# Patient Record
Sex: Male | Born: 1955 | Race: White | Hispanic: No | Marital: Married | Smoking: Never smoker
Health system: Southern US, Community
[De-identification: ages and names within clinical notes are randomized; demographics above are authoritative.]

## PROBLEM LIST (undated history)

## (undated) DIAGNOSIS — E785 Hyperlipidemia, unspecified: Secondary | ICD-10-CM

## (undated) DIAGNOSIS — H9190 Unspecified hearing loss, unspecified ear: Secondary | ICD-10-CM

## (undated) DIAGNOSIS — R911 Solitary pulmonary nodule: Secondary | ICD-10-CM

## (undated) DIAGNOSIS — Z87448 Personal history of other diseases of urinary system: Secondary | ICD-10-CM

## (undated) DIAGNOSIS — I10 Essential (primary) hypertension: Secondary | ICD-10-CM

## (undated) DIAGNOSIS — K649 Unspecified hemorrhoids: Secondary | ICD-10-CM

## (undated) DIAGNOSIS — I7121 Aneurysm of the ascending aorta, without rupture: Secondary | ICD-10-CM

## (undated) DIAGNOSIS — J189 Pneumonia, unspecified organism: Secondary | ICD-10-CM

## (undated) DIAGNOSIS — I712 Thoracic aortic aneurysm, without rupture: Secondary | ICD-10-CM

## (undated) DIAGNOSIS — N400 Enlarged prostate without lower urinary tract symptoms: Secondary | ICD-10-CM

## (undated) DIAGNOSIS — K219 Gastro-esophageal reflux disease without esophagitis: Secondary | ICD-10-CM

## (undated) DIAGNOSIS — E739 Lactose intolerance, unspecified: Secondary | ICD-10-CM

## (undated) DIAGNOSIS — G4700 Insomnia, unspecified: Secondary | ICD-10-CM

## (undated) DIAGNOSIS — R739 Hyperglycemia, unspecified: Secondary | ICD-10-CM

## (undated) HISTORY — DX: Lactose intolerance, unspecified: E73.9

## (undated) HISTORY — DX: Essential (primary) hypertension: I10

## (undated) HISTORY — DX: Hyperlipidemia, unspecified: E78.5

## (undated) HISTORY — DX: Hyperglycemia, unspecified: R73.9

## (undated) HISTORY — DX: Insomnia, unspecified: G47.00

## (undated) HISTORY — DX: Gastro-esophageal reflux disease without esophagitis: K21.9

## (undated) HISTORY — PX: KNEE SURGERY: SHX244

## (undated) HISTORY — DX: Aneurysm of the ascending aorta, without rupture: I71.21

## (undated) HISTORY — DX: Solitary pulmonary nodule: R91.1

## (undated) HISTORY — DX: Pneumonia, unspecified organism: J18.9

## (undated) HISTORY — DX: Unspecified hemorrhoids: K64.9

## (undated) HISTORY — DX: Unspecified hearing loss, unspecified ear: H91.90

## (undated) HISTORY — DX: Personal history of other diseases of urinary system: Z87.448

## (undated) HISTORY — DX: Benign prostatic hyperplasia without lower urinary tract symptoms: N40.0

## (undated) HISTORY — DX: Thoracic aortic aneurysm, without rupture: I71.2

---

## 1977-08-05 HISTORY — PX: OTHER SURGICAL HISTORY: SHX169

## 1999-08-06 HISTORY — PX: OTHER SURGICAL HISTORY: SHX169

## 2004-09-25 ENCOUNTER — Ambulatory Visit: Payer: Self-pay | Admitting: Internal Medicine

## 2004-10-03 ENCOUNTER — Ambulatory Visit: Payer: Self-pay | Admitting: Internal Medicine

## 2005-02-08 ENCOUNTER — Ambulatory Visit: Payer: Self-pay | Admitting: Internal Medicine

## 2010-06-14 ENCOUNTER — Ambulatory Visit: Payer: Self-pay | Admitting: Cardiology

## 2010-06-15 ENCOUNTER — Ambulatory Visit: Payer: Self-pay | Admitting: Cardiology

## 2011-06-17 ENCOUNTER — Other Ambulatory Visit: Payer: Self-pay | Admitting: Nurse Practitioner

## 2011-06-17 MED ORDER — SIMVASTATIN 20 MG PO TABS: 20.0000 mg | ORAL_TABLET | Freq: Every day | ORAL | Status: AC

## 2011-07-24 ENCOUNTER — Other Ambulatory Visit: Payer: Self-pay | Admitting: Cardiology

## 2011-08-15 ENCOUNTER — Other Ambulatory Visit: Payer: Self-pay | Admitting: Nurse Practitioner

## 2011-08-19 ENCOUNTER — Other Ambulatory Visit: Payer: Self-pay | Admitting: Nurse Practitioner

## 2011-10-17 ENCOUNTER — Encounter: Payer: Self-pay | Admitting: *Deleted

## 2011-10-17 DIAGNOSIS — Z87448 Personal history of other diseases of urinary system: Secondary | ICD-10-CM | POA: Insufficient documentation

## 2015-05-22 ENCOUNTER — Other Ambulatory Visit: Payer: Self-pay | Admitting: Internal Medicine

## 2015-05-22 DIAGNOSIS — J181 Lobar pneumonia, unspecified organism: Principal | ICD-10-CM

## 2015-05-22 DIAGNOSIS — J189 Pneumonia, unspecified organism: Secondary | ICD-10-CM

## 2015-05-26 ENCOUNTER — Ambulatory Visit
Admission: RE | Admit: 2015-05-26 | Discharge: 2015-05-26 | Disposition: A | Discharge status: Home / Self Care | Payer: BLUE CROSS/BLUE SHIELD | Source: Ambulatory Visit | Attending: Internal Medicine | Admitting: Internal Medicine

## 2015-05-26 DIAGNOSIS — J189 Pneumonia, unspecified organism: Secondary | ICD-10-CM

## 2015-05-26 DIAGNOSIS — J181 Lobar pneumonia, unspecified organism: Principal | ICD-10-CM

## 2015-05-26 MED ORDER — IOPAMIDOL (ISOVUE-300) INJECTION 61%
75.0000 mL | Freq: Once | INTRAVENOUS | Status: AC | PRN
  Administered 2015-05-26: 75 mL via INTRAVENOUS

## 2015-05-29 ENCOUNTER — Other Ambulatory Visit: Payer: Self-pay | Admitting: Internal Medicine

## 2015-05-29 DIAGNOSIS — R918 Other nonspecific abnormal finding of lung field: Secondary | ICD-10-CM

## 2015-05-29 DIAGNOSIS — J189 Pneumonia, unspecified organism: Secondary | ICD-10-CM

## 2015-05-29 DIAGNOSIS — J181 Lobar pneumonia, unspecified organism: Secondary | ICD-10-CM

## 2015-06-26 ENCOUNTER — Other Ambulatory Visit: Payer: BLUE CROSS/BLUE SHIELD

## 2015-07-03 ENCOUNTER — Ambulatory Visit
Admission: RE | Admit: 2015-07-03 | Discharge: 2015-07-03 | Disposition: A | Discharge status: Home / Self Care | Payer: BLUE CROSS/BLUE SHIELD | Source: Ambulatory Visit | Attending: Internal Medicine | Admitting: Internal Medicine

## 2015-07-03 DIAGNOSIS — J189 Pneumonia, unspecified organism: Secondary | ICD-10-CM

## 2015-07-03 DIAGNOSIS — R918 Other nonspecific abnormal finding of lung field: Secondary | ICD-10-CM

## 2015-07-03 DIAGNOSIS — J181 Lobar pneumonia, unspecified organism: Secondary | ICD-10-CM

## 2015-07-03 MED ORDER — IOPAMIDOL (ISOVUE-300) INJECTION 61%
75.0000 mL | Freq: Once | INTRAVENOUS | Status: AC | PRN
  Administered 2015-07-03: 75 mL via INTRAVENOUS

## 2015-07-11 ENCOUNTER — Encounter: Payer: Self-pay | Admitting: Thoracic Surgery (Cardiothoracic Vascular Surgery)

## 2015-07-11 ENCOUNTER — Institutional Professional Consult (permissible substitution) (INDEPENDENT_AMBULATORY_CARE_PROVIDER_SITE_OTHER): Payer: BLUE CROSS/BLUE SHIELD | Admitting: Thoracic Surgery (Cardiothoracic Vascular Surgery)

## 2015-07-11 VITALS — BP 128/80 | HR 86 | Resp 20 | Ht 68.0 in | Wt 163.0 lb

## 2015-07-11 DIAGNOSIS — I7121 Aneurysm of the ascending aorta, without rupture: Secondary | ICD-10-CM

## 2015-07-11 DIAGNOSIS — I712 Thoracic aortic aneurysm, without rupture, unspecified: Secondary | ICD-10-CM

## 2015-07-11 DIAGNOSIS — I10 Essential (primary) hypertension: Secondary | ICD-10-CM | POA: Insufficient documentation

## 2015-07-11 NOTE — Progress Notes (Signed)
PCP is Gwen PoundsUSSO,JOHN M, MD Referring Provider is Creola Cornusso, John, MD  Chief Complaint  Patient presents with  . Thoracic Aortic Aneurysm    Surgical eval, Chest CT 07/03/2015    HPI: [name] is a 59 year old gentleman sent for consultation regarding an ascending aortic aneurysm.  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 He was in his usual state of health until October when he developed a left lower lobe pneumonia. A CT of the chest showed a 2.9 x 3.3 cm masslike opacity in the lateral basilar segment of the left lower lobe. He was also noted to have a 4.7 cm aneurysm. A follow-up CT was done in November which showed resolution of the pneumonia and more accurately measured the aorta at 4.5 cm.  Other than having pneumonia he has felt well. He does still have a residual nonproductive cough. He does not have any shortness of breath or wheezing. He denies any chest pain, pressure, or tightness. He has not been having any palpitations, orthopnea, paroxysmal nocturnal dyspnea, or peripheral edema.   Past Medical History  Diagnosis Date  . Hyperlipidemia   . Hypertension   . H/O hematuria   . Ascending aortic aneurysm (HCC)   . Lung nodule   . Pneumonia   . GERD (gastroesophageal reflux disease)   . Lactose intolerance   . Hearing loss   . Insomnia   . BPH (benign prostatic hyperplasia)   . Hemorrhoid   . Hyperglycemia     Past Surgical History  Procedure Laterality Date  . Other surgical history  1979    appendectomy  . Knee surgery      left  . Other surgical history  2001    hernia surgery    Family History  Problem Relation Age of Onset  . Hypertension Mother   . Hypertension Father   . Hypertension Brother   . Cancer Brother     prostate    Social History Social History  Substance Use Topics  . Smoking status: Never Smoker   . Smokeless tobacco: Never Used   . Alcohol Use: 0.0 oz/week    0 Standard drinks or equivalent per week    Current Outpatient Prescriptions  Medication Sig Dispense Refill  . aspirin 81 MG tablet Take 81 mg by mouth daily.    . benzonatate (TESSALON) 100 MG capsule Take 100 mg by mouth 2 (two) times daily as needed.   0  . cefdinir (OMNICEF) 300 MG capsule Take 300 mg by mouth 2 (two) times daily.   0  . lisinopril (PRINIVIL,ZESTRIL) 5 MG tablet TAKE ONE (1) TABLET EACH DAY 30 tablet 0  . simvastatin (ZOCOR) 20 MG tablet Take 1 tablet (20 mg total) by mouth at bedtime. 30 tablet 1  . SYNTHROID 50 MCG tablet Take 50 mcg by mouth daily before breakfast.   2   No current facility-administered medications for this visit.    No Known Allergies  Review of Systems  Constitutional: Negative for fever, chills, activity change, appetite change and unexpected weight change.  HENT: Positive for hearing loss. Negative for trouble swallowing and voice change.   Eyes: Negative for visual disturbance.  Respiratory: Positive for cough (Nonproductive). Negative for shortness of breath and wheezing.   Cardiovascular: Negative for chest pain and leg swelling.  Gastrointestinal: Negative for abdominal pain and blood in stool.  Genitourinary: Positive for hematuria. Negative for difficulty urinating.  Musculoskeletal: Negative  for arthralgias and gait problem.  Neurological: Negative for syncope, speech difficulty and weakness.  Hematological: Negative for adenopathy. Does not bruise/bleed easily.  All other systems reviewed and are negative.   BP 128/80 mmHg  Pulse 86  Resp 20  Ht  (1.727 m)  Wt 163 lb (73.936 kg)  BMI 24.79 kg/m2  SpO2 98% Physical Exam  Constitutional: He is oriented to person, place, and time. He appears well-developed and well-nourished. No distress.  HENT:  Head: Normocephalic and atraumatic.  Mouth/Throat: No oropharyngeal exudate.  Eyes: Conjunctivae and EOM are normal. Pupils are equal, round,  and reactive to light. No scleral icterus.  Neck: Neck supple. No tracheal deviation present. No thyromegaly present.  No bruits  Cardiovascular: Normal rate, regular rhythm, normal heart sounds and intact distal pulses.  Exam reveals no gallop and no friction rub.   No murmur heard. Pulmonary/Chest: Effort normal and breath sounds normal. No respiratory distress. He has no wheezes. He has no rales.  Abdominal: Soft. He exhibits no distension. There is no tenderness.  Musculoskeletal: He exhibits no edema.  Lymphadenopathy:    He has no cervical adenopathy.  Neurological: He is alert and oriented to person, place, and time. No cranial nerve deficit.  Skin: Skin is warm and dry.  Vitals reviewed.    Diagnostic Tests: CT CHEST WITH CONTRAST  TECHNIQUE: Multidetector CT imaging of the chest was performed during intravenous contrast administration.  CONTRAST: 75 cc Isovue 300 intravenous  COMPARISON: 05/26/2015  FINDINGS: THORACIC INLET/BODY WALL:  No acute abnormality.  MEDIASTINUM:  Normal heart size. No pericardial effusion. Fusiform aneurysmal enlargement of the aortic root and ascending aorta. The ascending aorta measures up to 45 mm diameter today. Atherosclerosis of the aorta without acute finding. Left hilar and subcarinal nodes have decreased in size since prior. No adenopathy currently.  LUNG WINDOWS:  The rounded ill-defined opacity in the left lower lobe seen on previous study has contracted, now with a flat/linear opacity in the subpleural basilar lung. No rounded areas suggestive of malignancy. Previously seen internal cavitary features have resolved. No pleural effusion. No suspicious pulmonary nodule elsewhere in the lungs.  UPPER ABDOMEN:  No acute findings.  OSSEOUS:  No acute finding. No suspicious lytic or blastic lesions.  IMPRESSION: 1. Previously seen left lower lobe nodule has regressed, now with linear scar-like  appearance. Evolution compatible with pneumonia rather than malignancy. This area will be visualized on six-month follow-up chest CT for the patient's thoracic aortic aneurysm. 2. 45 mm at the ascending aortic aneurysm. Recommend semi-annual imaging followup by CTA or MRA and referral to cardiothoracic surgery if not already obtained. This recommendation follows 2010 ACCF/AHA/AATS/ACR/ASA/SCA/SCAI/SIR/STS/SVM Guidelines for the Diagnosis and Management of Patients With Thoracic Aortic Disease. Circulation. 2010; 121: G956-O130   Electronically Signed  By: Marnee Spring M.D.  On: 07/03/2015 09:29       I personally reviewed the CT scans and concur with the findings as noted above Impression:  59 year old man with a recent pneumonia who was incidentally found to have a 4.5 cm ascending aortic aneurysm. The aorta returns to normal size between the innominate and left carotid. There is no indication for surgery at this time.  I reviewed the CT images with Mr. and Mrs. Canlas.  We discussed indications for surgery for ascending aneurysms. In this location, a size of 5.5- 6 cm would be an indication for surgery or if there was greater than 5 mm interval growth over a six-month period.   They understand  the importance of blood pressure control.  We discussed the symptoms of rupture and/or dissection and he knows to call for emergent medical assistance immediately if those were to occur.  Plan: Return in 6 months with MRA and June to follow-up ascending aortic aneurysm  I spent 30 minutes with Mr. Vieau during this visit, greater than 50% was spent counseling. Loreli Slot, MD [facility] [phone]

## 2015-07-12 ENCOUNTER — Encounter: Payer: Self-pay | Admitting: Thoracic Surgery (Cardiothoracic Vascular Surgery)

## 2015-12-27 ENCOUNTER — Other Ambulatory Visit: Payer: Self-pay | Admitting: *Deleted

## 2015-12-27 DIAGNOSIS — I7121 Aneurysm of the ascending aorta, without rupture: Secondary | ICD-10-CM

## 2015-12-27 DIAGNOSIS — I712 Thoracic aortic aneurysm, without rupture: Secondary | ICD-10-CM

## 2016-01-16 ENCOUNTER — Ambulatory Visit
Admission: RE | Admit: 2016-01-16 | Discharge: 2016-01-16 | Disposition: A | Discharge status: Home / Self Care | Payer: 59 | Source: Ambulatory Visit | Attending: Thoracic Surgery (Cardiothoracic Vascular Surgery) | Admitting: Thoracic Surgery (Cardiothoracic Vascular Surgery)

## 2016-01-16 ENCOUNTER — Ambulatory Visit (INDEPENDENT_AMBULATORY_CARE_PROVIDER_SITE_OTHER): Payer: 59 | Admitting: Thoracic Surgery (Cardiothoracic Vascular Surgery)

## 2016-01-16 ENCOUNTER — Encounter: Payer: Self-pay | Admitting: Thoracic Surgery (Cardiothoracic Vascular Surgery)

## 2016-01-16 ENCOUNTER — Other Ambulatory Visit: Payer: Self-pay | Admitting: Thoracic Surgery (Cardiothoracic Vascular Surgery)

## 2016-01-16 VITALS — BP 94/60 | HR 94 | Resp 16 | Ht 68.0 in | Wt 160.0 lb

## 2016-01-16 DIAGNOSIS — I712 Thoracic aortic aneurysm, without rupture, unspecified: Secondary | ICD-10-CM

## 2016-01-16 DIAGNOSIS — I7121 Aneurysm of the ascending aorta, without rupture: Secondary | ICD-10-CM

## 2016-01-16 DIAGNOSIS — Z77018 Contact with and (suspected) exposure to other hazardous metals: Secondary | ICD-10-CM

## 2016-01-16 MED ORDER — GADOBENATE DIMEGLUMINE 529 MG/ML IV SOLN
15.0000 mL | Freq: Once | INTRAVENOUS | Status: AC | PRN
  Administered 2016-01-16: 15 mL via INTRAVENOUS

## 2016-01-16 NOTE — Progress Notes (Signed)
[address]       [name] [address]             [phone]       HPI: Mr. Schroader returns for a scheduled 6 month follow-up visit.  He is a 60 year old gentleman with a past medical history significant for hypertension, hyperlipidemia, hypothyroidism and pneumonia. He was in his usual state of health until October 2016 when he developed a left lower lobe pneumonia. A CT of the chest showed a 2.9 x 3.3 cm masslike opacity in the lateral basilar segment of the left lower lobe. He was also noted to have a 4.7 cm aneurysm. A follow-up CT was done in November which showed resolution of the pneumonia and more accurately measured the aorta at 4.5 cm.  Since his last visit he has been feeling well. He denies shortness of breath or wheezing, any chest pain, pressure, or tightness. He also denies palpitations, orthopnea, paroxysmal nocturnal dyspnea, peripheral edema, or change in exercise tolerance..  Past Medical History  Diagnosis Date  . Hyperlipidemia   . Hypertension   . H/O hematuria   . Ascending aortic aneurysm (HCC)   . Lung nodule   . Pneumonia   . GERD (gastroesophageal reflux disease)   . Lactose intolerance   . Hearing loss   . Insomnia   . BPH (benign prostatic hyperplasia)   . Hemorrhoid   . Hyperglycemia       Current Outpatient Prescriptions  Medication Sig Dispense Refill  . aspirin 81 MG tablet Take 81 mg by mouth daily.    Marland Kitchen levothyroxine (SYNTHROID, LEVOTHROID) 75 MCG tablet Take 75 mcg by mouth daily before breakfast.    . lisinopril (PRINIVIL,ZESTRIL) 5 MG tablet TAKE ONE (1) TABLET EACH DAY 30 tablet 0  . simvastatin (ZOCOR) 20 MG tablet Take 1 tablet (20 mg total) by mouth at bedtime. (Patient taking differently: Take 20 mg by mouth daily at 6 PM. ) 30 tablet 1   No current facility-administered medications for this visit.    Physical Exam BP 94/60 mmHg  Pulse 94  Resp 16  Ht  (1.727 m)  Wt 160 lb (72.576 kg)  BMI 24.33  kg/m2  SpO2 98%  Well-appearing 60 year old man in no acute distress Alert and oriented 3 with no focal deficits No carotid bruits or cervical adenopathy Lungs clear with equal breath sounds bilaterally Cardiac regular rate and rhythm normal S1 and S2 no audible murmur Pulses 2+ and symmetrical throughout  Diagnostic Tests: MRA CHEST WITH OR WITHOUT CONTRAST  TECHNIQUE: Angiographic images of the chest were obtained using MRA technique without and with intravenous contrast.  Creatinine was obtained on site at [facility] at [address]  Results: Creatinine 0.9 mg/dL.  COMPARISON: 07/03/2015  FINDINGS: Maximal diameter of the ascending aorta is 4.7 cm. Previously, maximal diameter was 4.6 cm. There is no evidence of aortic dissection, intramural hematoma, or transection. Great vessels are patent. Descending aorta is patent without significant atherosclerotic change. Celiac and SMA are patent. Bilateral renal arteries are patent.  There is no evidence of mediastinal mass effect.  Lungs are grossly clear.  No evidence of pleural effusion.  IMPRESSION: Ascending aortic aneurysm is increased from 4.5 cm to 4.7 cm. Ascending thoracic aortic aneurysm. Recommend semi-annual imaging followup by CTA or MRA and referral to cardiothoracic surgery if not already obtained. This recommendation follows 2010 ACCF/AHA/AATS/ACR/ASA/SCA/SCAI/SIR/STS/SVM Guidelines for the Diagnosis and Management of Patients With Thoracic Aortic  Disease. Circulation. 2010; 121: Z610-[id number]  There is no evidence of aortic dissection. Great vessels are patent.   Electronically Signed  By: Jolaine ClickArthur Hoss M.D.  On: 01/16/2016 12:28  I personally reviewed the MR angiogram. I do not think the aneurysm has changed. I think there is been some slight variations in measurement technique. If there has been a change its approximately a millimeter.  Impression: 60 year old man with  a 4.5-4.7 cm ascending aortic aneurysm. This has not changed appreciably over the past 6 months. He does need continued follow-up indefinitely.   He has a history of hypertension. His blood pressure currently is well controlled on 5 mg of lisinopril daily.  He is on simvastatin for hyperlipidemia.  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 Given that it is below the threshold for intervention, I do not think there is any reason to prevent him from flying.  Plan:  Return in 6 months with CT angiogram of chest (we'll do CT for one final check on left lower lobe)  Loreli SlotSteven C Axle Parfait, MD [facility] [phone]

## 2016-02-27 IMAGING — CT CT CHEST W/ CM
2 of 3 series · 15 of 36 positions shown, 18 images · IV contrast (isovue)
Comparison: 05/26/2015

CLINICAL DATA: Followup lung mass in the left lower lobe,
pneumonia.

EXAM:
CT CHEST WITH CONTRAST
TECHNIQUE: Multidetector CT imaging of the chest was performed during
intravenous contrast administration.
CONTRAST:  75 cc Isovue 300 intravenous

[Series 3: chest w/cm · axial · 0.70mm/px · z∈[-357,-77]mm · 12 of 66 slices shown, 15 images]
[im 5/66  mediastinal]
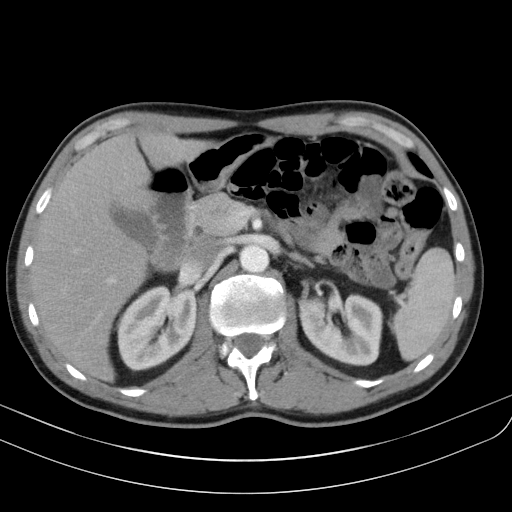
[im 5/66  lung]
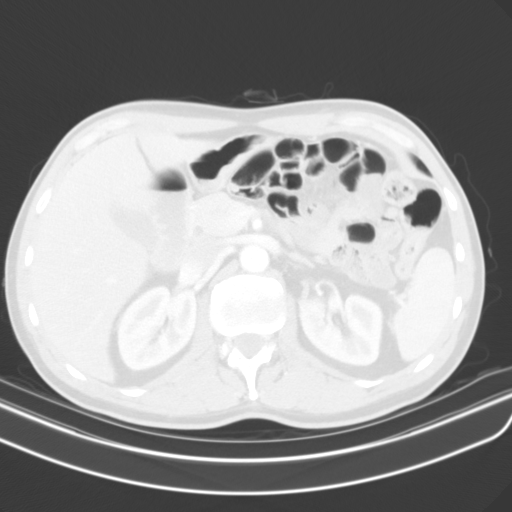
[im 10/66  lung]
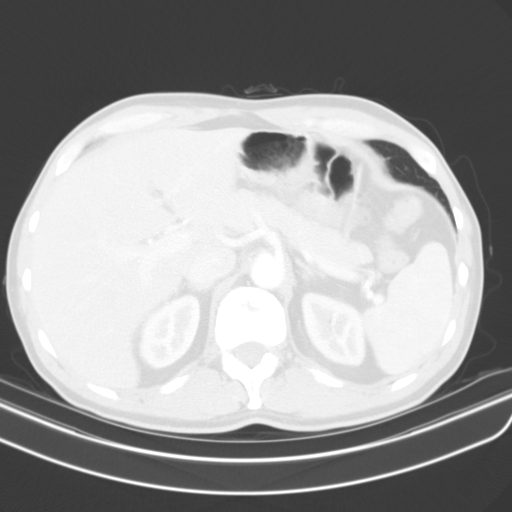
[im 15/66  lung]
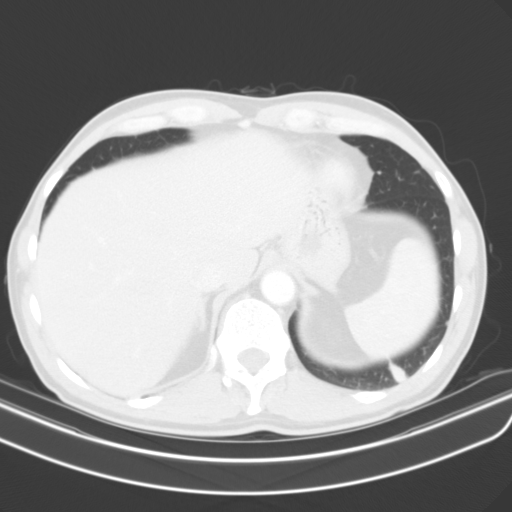
[im 20/66  lung]
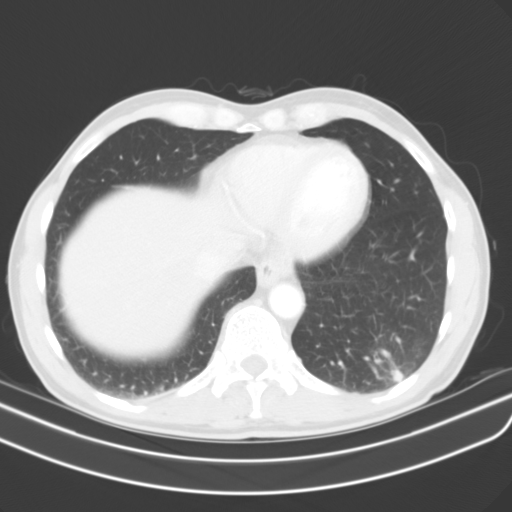
[im 25/66  mediastinal]
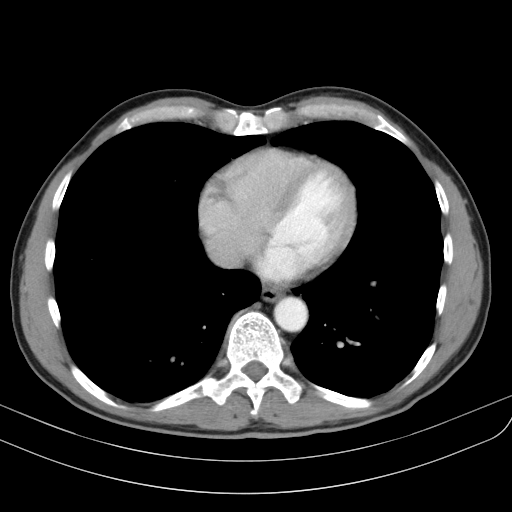
[im 25/66  lung]
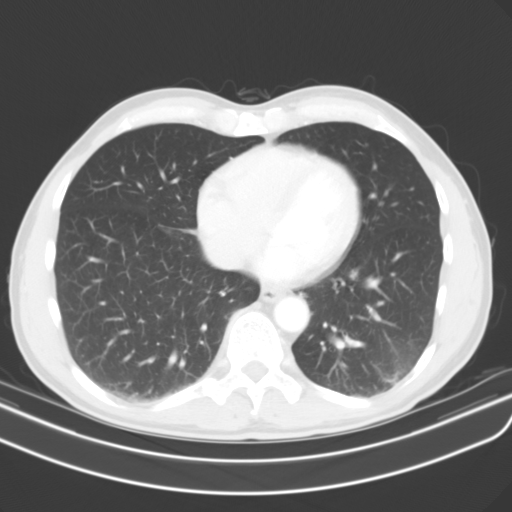
[im 29/66  lung]
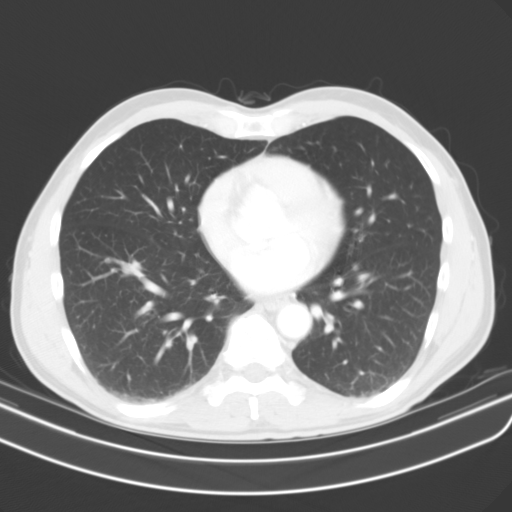
[im 37/66  lung]
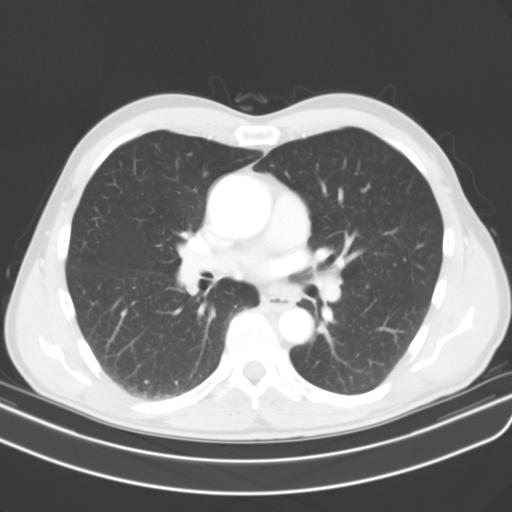
[im 41/66  lung]
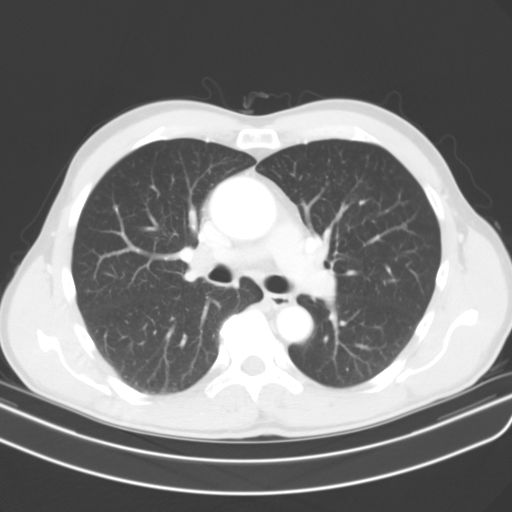
[im 46/66  mediastinal]
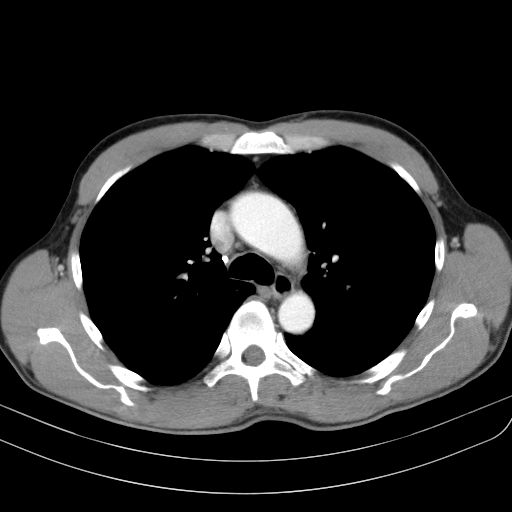
[im 46/66  lung]
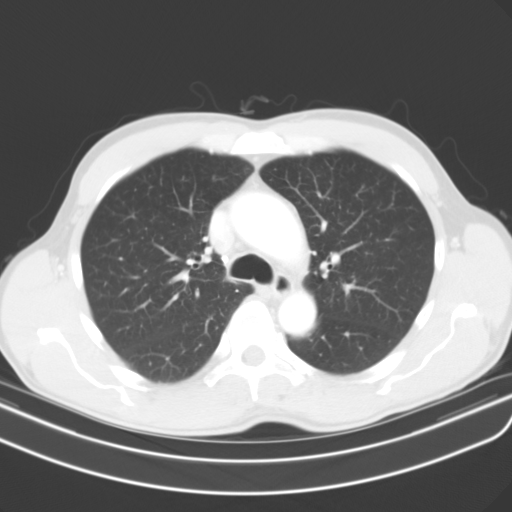
[im 51/66  lung]
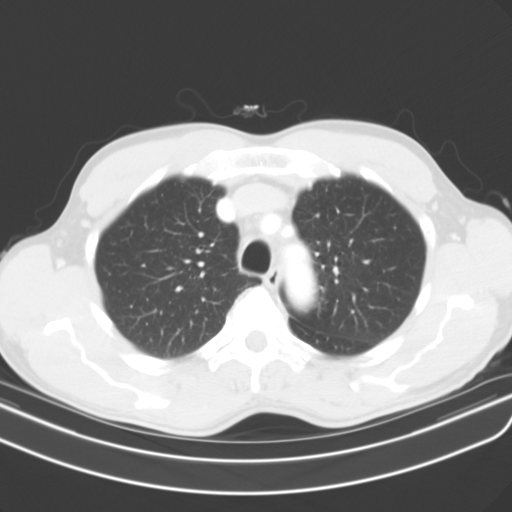
[im 56/66  lung]
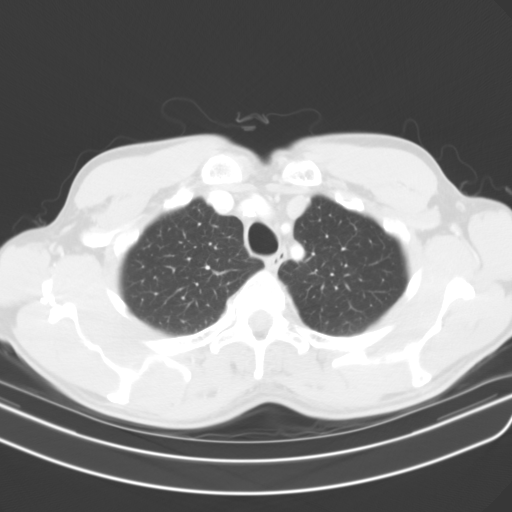
[im 61/66  lung]
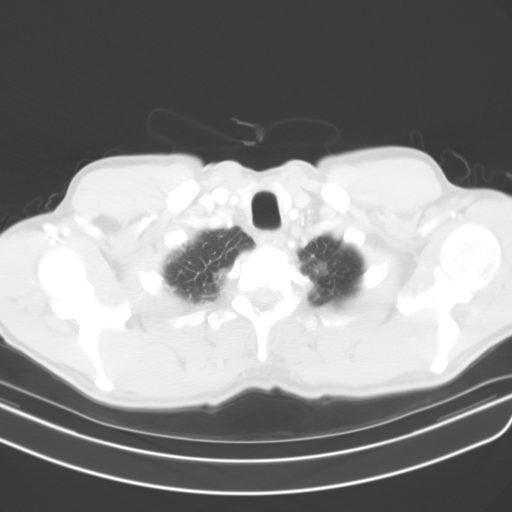

[Series 4: cor · coronal · 0.64mm/px · 3 of 79 slices shown]
[im 16/79  lung]
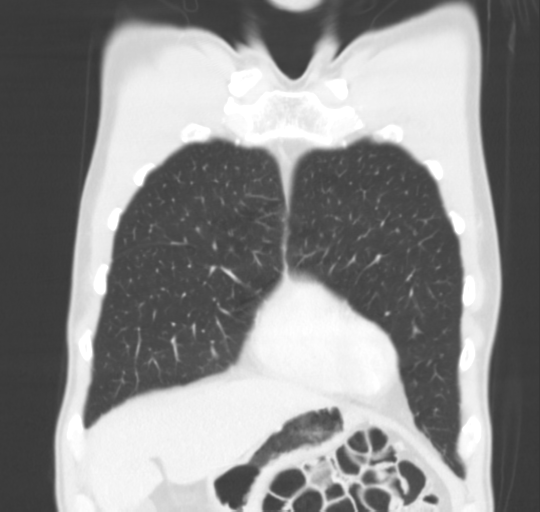
[im 32/79  lung]
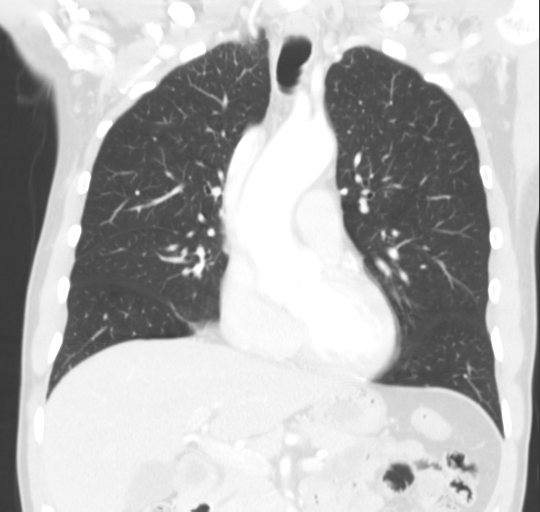
[im 47/79  lung]
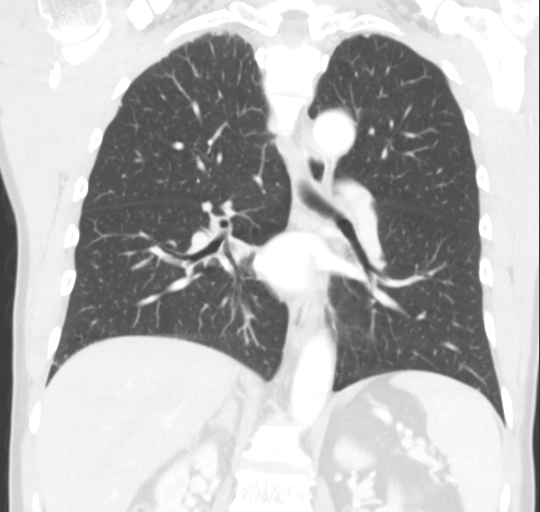

[15 of 36 positions shown; findings below may reference images not displayed]

FINDINGS: THORACIC INLET/BODY WALL:

No acute abnormality.

MEDIASTINUM:

Normal heart size. No pericardial effusion. Fusiform aneurysmal
enlargement of the aortic root and ascending aorta. The ascending
aorta measures up to 45 mm diameter today. Atherosclerosis of the
aorta without acute finding. Left hilar and subcarinal nodes have
decreased in size since prior. No adenopathy currently.

LUNG WINDOWS:

The rounded ill-defined opacity in the left lower lobe seen on
previous study has contracted, now with a flat/linear opacity in the
subpleural basilar lung. No rounded areas suggestive of malignancy.
Previously seen internal cavitary features have resolved. No pleural
effusion. No suspicious pulmonary nodule elsewhere in the lungs.

UPPER ABDOMEN:

No acute findings.

OSSEOUS:

No acute finding.  No suspicious lytic or blastic lesions.
IMPRESSION: 1. Previously seen left lower lobe nodule has regressed, now with
linear scar-like appearance. Evolution compatible with pneumonia
rather than malignancy. This area will be visualized on six-month
follow-up chest CT for the patient's thoracic aortic aneurysm.
2. 45 mm at the ascending aortic aneurysm. Recommend semi-annual
imaging followup by CTA or MRA and referral to cardiothoracic
surgery if not already obtained. This recommendation follows 0313
ACCF/AHA/AATS/ACR/ASA/SCA/JINEESH/CASA/NRDN/MITSUSHIRO Guidelines for the
Diagnosis and Management of Patients With Thoracic Aortic Disease.
Circulation. 0313; 121: [id number]

## 2016-05-06 ENCOUNTER — Ambulatory Visit (INDEPENDENT_AMBULATORY_CARE_PROVIDER_SITE_OTHER): Payer: Self-pay | Admitting: Family Medicine

## 2016-05-06 VITALS — BP 111/76 | HR 84 | Ht 69.3 in | Wt 162.0 lb

## 2016-05-06 DIAGNOSIS — Z0289 Encounter for other administrative examinations: Secondary | ICD-10-CM

## 2016-05-06 NOTE — Progress Notes (Signed)
   BP 111/76 (BP Location: Left Arm, Patient Position: Sitting, Cuff Size: Normal)   Pulse 84   Ht 5' 9.3" (1.76 m)   Wt 162 lb (73.5 kg)   BMI 23.72 kg/m    Subjective:    Patient ID: Paul Barrett, male    DOB: 1956-06-18, 60 y.o.   MRN: [id number]  HPI: Paul Barrett is a 60 y.o. male  Chief Complaint  Patient presents with  . Class III Flight Physical   Airman has hypertension well controlled Also has hypothyroidism well controlled On chart review Airman has thoracic aortic aneurysm Will need a special issuance. Reviewed aneurysm information and 2 small for surgery. Relevant past medical, surgical, family and social history reviewed and updated as indicated. Interim medical history since our last visit reviewed. Allergies and medications reviewed and updated.  Review of Systems  Constitutional: Negative.   HENT: Negative.   Eyes: Negative.   Respiratory: Negative.   Cardiovascular: Negative.   Gastrointestinal: Negative.   Endocrine: Negative.   Genitourinary: Negative.   Musculoskeletal: Negative.   Skin: Negative.   Allergic/Immunologic: Negative.   Neurological: Negative.   Hematological: Negative.   Psychiatric/Behavioral: Negative.     Per HPI unless specifically indicated above     Objective:    BP 111/76 (BP Location: Left Arm, Patient Position: Sitting, Cuff Size: Normal)   Pulse 84   Ht 5' 9.3" (1.76 m)   Wt 162 lb (73.5 kg)   BMI 23.72 kg/m   Wt Readings from Last 3 Encounters:  05/06/16 162 lb (73.5 kg)  01/16/16 160 lb (72.6 kg)  07/11/15 163 lb (73.9 kg)    Physical Exam  Constitutional: He is oriented to person, place, and time. He appears well-developed and well-nourished.  HENT:  Head: Normocephalic.  Right Ear: External ear normal.  Left Ear: External ear normal.  Nose: Nose normal.  Eyes: Conjunctivae and EOM are normal. Pupils are equal, round, and reactive to light.  Neck: Normal range of motion. Neck supple. No thyromegaly  present.  Cardiovascular: Normal rate, regular rhythm, normal heart sounds and intact distal pulses.   Pulmonary/Chest: Effort normal and breath sounds normal.  Abdominal: Soft. Bowel sounds are normal. There is no splenomegaly or hepatomegaly.  Genitourinary: Penis normal.  Musculoskeletal: Normal range of motion.  Lymphadenopathy:    He has no cervical adenopathy.  Neurological: He is alert and oriented to person, place, and time. He has normal reflexes.  Skin: Skin is warm and dry.  Psychiatric: He has a normal mood and affect. His behavior is normal. Judgment and thought content normal.    No results found for this or any previous visit.    Assessment & Plan:   Problem List Items Addressed This Visit    None    Visit Diagnoses    History and physical examination, occupation    -  Primary      FAA physical exam was deferred for thoracic aneurysm CACI issued for hypertension and hypothyroid. Follow up plan: Return if symptoms worsen or fail to improve.

## 2016-06-14 ENCOUNTER — Other Ambulatory Visit: Payer: Self-pay | Admitting: *Deleted

## 2016-06-14 DIAGNOSIS — I712 Thoracic aortic aneurysm, without rupture: Secondary | ICD-10-CM

## 2016-06-14 DIAGNOSIS — I7121 Aneurysm of the ascending aorta, without rupture: Secondary | ICD-10-CM

## 2016-06-19 ENCOUNTER — Telehealth: Payer: Self-pay | Admitting: Family Medicine

## 2016-06-19 NOTE — Telephone Encounter (Signed)
Pt's wife called to check the status of the pt's flight physical. Please call pt's wife @ [phone]. Thanks.

## 2016-06-20 NOTE — Telephone Encounter (Signed)
Call wife and tell Waiting on FAA no news yet. Paul LericheMark

## 2016-06-20 NOTE — Telephone Encounter (Signed)
Called and spoke with pt's wife and informed her we are waiting on a response from the FAA. Thanks.

## 2016-07-16 ENCOUNTER — Encounter: Payer: Self-pay | Admitting: Thoracic Surgery (Cardiothoracic Vascular Surgery)

## 2016-07-16 ENCOUNTER — Ambulatory Visit
Admission: RE | Admit: 2016-07-16 | Discharge: 2016-07-16 | Disposition: A | Discharge status: Home / Self Care | Payer: 59 | Source: Ambulatory Visit | Attending: Thoracic Surgery (Cardiothoracic Vascular Surgery) | Admitting: Thoracic Surgery (Cardiothoracic Vascular Surgery)

## 2016-07-16 ENCOUNTER — Ambulatory Visit (INDEPENDENT_AMBULATORY_CARE_PROVIDER_SITE_OTHER): Payer: 59 | Admitting: Thoracic Surgery (Cardiothoracic Vascular Surgery)

## 2016-07-16 VITALS — BP 121/71 | HR 84 | Resp 16 | Ht 69.0 in | Wt 162.0 lb

## 2016-07-16 DIAGNOSIS — I7121 Aneurysm of the ascending aorta, without rupture: Secondary | ICD-10-CM

## 2016-07-16 DIAGNOSIS — I712 Thoracic aortic aneurysm, without rupture, unspecified: Secondary | ICD-10-CM

## 2016-07-16 MED ORDER — IOPAMIDOL (ISOVUE-370) INJECTION 76%
80.0000 mL | Freq: Once | INTRAVENOUS | Status: AC | PRN
  Administered 2016-07-16: 80 mL via INTRAVENOUS

## 2016-07-16 NOTE — Progress Notes (Signed)
[address]       [place] [id number]             [phone]     HPI: [name] returns for follow-up of his ascending aneurysm.  [name] is a 60 year old man with a history of hypertension, hyperlipidemia, pneumonia, gastroesophageal reflux, and an asymptomatic ascending aortic aneurysm. He developed a left lower lobe pneumonia back in October 2016. CT of the chest showed a 2.9 x 3.3 cm masslike opacity in the lateral basilar segment of the left lower lobe. He was also noted to have an ascending aneurysm. A follow-up CT in November showed significant improvement but not complete resolution of the pneumonia. The aneurysm was measured at 4.5 cm. I saw him in December and then again in June of this year. The aneurysm was unchanged in June.  He has been feeling well. He denies any chest pain, pressure, or tightness. He denies any headaches, visual changes, dizzy spells or syncopal spells. He does not have any shortness of breath, cough or wheezing.  Past Medical History:  Diagnosis Date  . Ascending aortic aneurysm (HCC)   . BPH (benign prostatic hyperplasia)   . GERD (gastroesophageal reflux disease)   . H/O hematuria   . Hearing loss   . Hemorrhoid   . Hyperglycemia   . Hyperlipidemia   . Hypertension   . Insomnia   . Lactose intolerance   . Lung nodule   . Pneumonia    Past Surgical History:  Procedure Laterality Date  . KNEE SURGERY     left  . OTHER SURGICAL HISTORY  1979   appendectomy  . OTHER SURGICAL HISTORY  2001   hernia surgery     Current Outpatient Prescriptions  Medication Sig Dispense Refill  . aspirin 81 MG tablet Take 81 mg by mouth daily.    Marland Kitchen. levothyroxine (SYNTHROID, LEVOTHROID) 75 MCG tablet Take 75 mcg by mouth daily before breakfast.    . lisinopril (PRINIVIL,ZESTRIL) 5 MG tablet TAKE ONE (1) TABLET EACH DAY 30 tablet 0  . simvastatin (ZOCOR) 20 MG tablet Take 1 tablet (20 mg total) by mouth at bedtime. (Patient taking  differently: Take 20 mg by mouth daily at 6 PM. ) 30 tablet 1   No current facility-administered medications for this visit.     Physical Exam BP 121/71 (BP Location: Right Arm, Patient Position: Sitting, Cuff Size: Large)   Pulse 84   Resp 16   Ht 5\' 9"  (1.753 m)   Wt 162 lb (73.5 kg)   SpO2 96% Comment: RA  BMI 23.7592 kg/m  60 year old man in no acute distress Alert and oriented 3 with no focal deficits No carotid bruits Cardiac regular rate and rhythm, normal S1 and S2, no murmur Lungs clear with equal breath sounds bilaterally  Diagnostic Tests: CT ANGIOGRAPHY CHEST WITH CONTRAST  TECHNIQUE: Multidetector CT imaging of the chest was performed using the standard protocol during bolus administration of intravenous contrast. Multiplanar CT image reconstructions and MIPs were obtained to evaluate the vascular anatomy.  CONTRAST:  80 mL Isovue 370 IV  COMPARISON:  MR chest dated 01/16/2016  FINDINGS: Cardiovascular: Stable ascending thoracic aortic aneurysm. Thoracic aortic measurements are as follows:  --4.0 cm at the sinuses of Valsalva (series 4/ image 68)  --4.5 cm at the ascending aortic arch (series 4/ image 5), unchanged  --2.5 cm at the proximal descending thoracic aorta (series 4/image 33)  --2.3 cm at the aortic hiatus (series  4/image 94)  The heart is normal in size.  No pericardial effusion.  Mediastinum/Nodes: No suspicious mediastinal lymphadenopathy.  Visualized thyroid is unremarkable.  Lungs/Pleura: 4 mm nodule in the central right upper lobe (series 11/image 20), unchanged since 2016, benign.  Partially calcified subpleural granuloma in the posterior left upper lobe (series 11/ image 13).  No focal consolidation.  No pleural effusion or pneumothorax.  Upper Abdomen: Visualized upper abdomen is unremarkable.  Musculoskeletal: Mild degenerative changes of the mid thoracic spine.  Review of the MIP images confirms the  above findings.  IMPRESSION: Stable 4.5 cm ascending thoracic aortic aneurysm.  Recommend semi-annual imaging followup by CTA or MRA grand radiata excretion. This recommendation follows 2010 ACCF/AHA/AATS/ACR/ASA/SCA/SCAI/SIR/STS/SVM Guidelines for the Diagnosis and Management of Patients With Thoracic Aortic Disease. Circulation. 2010; 121: Z610-R604: [id number]   Electronically Signed   By: Charline BillsSriyesh  Krishnan M.D.   On: 07/16/2016 12:37 I personally reviewed the CT chest and concur with the findings noted above.  Impression: [name] is a 60 year old gentleman with a 4.5 cm ascending thoracic aneurysm. This is been stable for over a year now. He is completely asymptomatic. Treatment remains blood pressure control. He needs continued follow-up at six-month intervals. We will plan to do an MR at his next visit.  Hypertension- currently well controlled on lisinopril 5 mg daily. He has no evidence of any side effects such as cough or swelling.   Lung nodule- he had a nodular pneumonia back in October 2016. That has completely resolved with no residual. He has other benign small nodules less than 5 mm.  Plan: Continue with current care.  Return in 6 months with MR angiogram  Loreli SlotSteven C Ellizabeth Dacruz, MD [facility] [phone]

## 2016-07-30 ENCOUNTER — Encounter: Payer: Self-pay | Admitting: Thoracic Surgery (Cardiothoracic Vascular Surgery)

## 2016-12-20 ENCOUNTER — Other Ambulatory Visit: Payer: Self-pay | Admitting: Thoracic Surgery (Cardiothoracic Vascular Surgery)

## 2016-12-20 DIAGNOSIS — I712 Thoracic aortic aneurysm, without rupture: Secondary | ICD-10-CM

## 2016-12-20 DIAGNOSIS — I7121 Aneurysm of the ascending aorta, without rupture: Secondary | ICD-10-CM

## 2017-01-14 ENCOUNTER — Ambulatory Visit
Admission: RE | Admit: 2017-01-14 | Discharge: 2017-01-14 | Disposition: A | Discharge status: Home / Self Care | Payer: 59 | Source: Ambulatory Visit | Attending: Thoracic Surgery (Cardiothoracic Vascular Surgery) | Admitting: Thoracic Surgery (Cardiothoracic Vascular Surgery)

## 2017-01-14 ENCOUNTER — Encounter: Payer: Self-pay | Admitting: Thoracic Surgery (Cardiothoracic Vascular Surgery)

## 2017-01-14 ENCOUNTER — Ambulatory Visit (INDEPENDENT_AMBULATORY_CARE_PROVIDER_SITE_OTHER): Payer: 59 | Admitting: Thoracic Surgery (Cardiothoracic Vascular Surgery)

## 2017-01-14 VITALS — BP 130/85 | HR 72 | Resp 20 | Ht 69.0 in | Wt 158.0 lb

## 2017-01-14 DIAGNOSIS — I712 Thoracic aortic aneurysm, without rupture, unspecified: Secondary | ICD-10-CM

## 2017-01-14 DIAGNOSIS — R911 Solitary pulmonary nodule: Secondary | ICD-10-CM | POA: Diagnosis not present

## 2017-01-14 DIAGNOSIS — I7121 Aneurysm of the ascending aorta, without rupture: Secondary | ICD-10-CM

## 2017-01-14 MED ORDER — GADOBENATE DIMEGLUMINE 529 MG/ML IV SOLN
15.0000 mL | Freq: Once | INTRAVENOUS | Status: AC | PRN
  Administered 2017-01-14: 15 mL via INTRAVENOUS

## 2017-01-14 NOTE — Progress Notes (Signed)
[address]       Paul Barrett [address]             [phone]    HPI: Paul Barrett returns for a scheduled 6 month follow-up  Paul Barrett is a 61 year old man with a history of hypertension, hyperlipidemia, gastroesophageal reflux, pneumonia, and an asymptomatic ascending aortic aneurysm.  His aneurysm was first noted in October 2016 when he was being evaluated for a left lower lobe pneumonia. There was a 2.9 x 3.3 cm masslike opacity in the left lower lobe. That eventually resolved over sequential scans. He also was noted to have a 4.5 cm ascending aneurysm. I last saw him in December 2017. The aneurysm was unchanged at that time.  In the interim since his last visit he has not had any medical issues. His pressure has been well-controlled. He's not having any chest pain, pressure, tightness, or shortness of breath.  Past Medical History:  Diagnosis Date  . Ascending aortic aneurysm (HCC)   . BPH (benign prostatic hyperplasia)   . GERD (gastroesophageal reflux disease)   . H/O hematuria   . Hearing loss   . Hemorrhoid   . Hyperglycemia   . Hyperlipidemia   . Hypertension   . Insomnia   . Lactose intolerance   . Lung nodule   . Pneumonia     Current Outpatient Prescriptions  Medication Sig Dispense Refill  . aspirin 81 MG tablet Take 81 mg by mouth daily.    Marland Kitchen levothyroxine (SYNTHROID, LEVOTHROID) 75 MCG tablet Take 75 mcg by mouth daily before breakfast.    . lisinopril (PRINIVIL,ZESTRIL) 5 MG tablet TAKE ONE (1) TABLET EACH DAY 30 tablet 0  . simvastatin (ZOCOR) 20 MG tablet Take 1 tablet (20 mg total) by mouth at bedtime. (Patient taking differently: Take 20 mg by mouth daily at 6 PM. ) 30 tablet 1   No current facility-administered medications for this visit.     Physical Exam BP 130/85   Pulse 72   Resp 20   Ht 5\' 9"  (1.753 m)   Wt 158 lb (71.7 kg)   SpO2 99% Comment: RA  BMI 23.40 kg/m  61 year old man in no acute distress Well-developed  and well-nourished Alert and oriented 3 with no focal deficits No carotid bruits Cardiac regular rate and rhythm normal S1 and S2 no rubs or murmurs Lungs clear with equal breath sounds bilaterally  Diagnostic Tests: MRA CHEST WITH CONTRAST  TECHNIQUE: Multiplanar, multiecho pulse sequences of the chest were obtained with intravenous contrast. Angiographic images of chest were obtained using MRA technique with intravenous contrast.  CONTRAST:  15mL MULTIHANCE GADOBENATE DIMEGLUMINE 529 MG/ML IV SOLN  COMPARISON:  07/16/2016  FINDINGS: Maximal aortic diameters are as follows:  Sinus of all solid bulla:  3.8 cm.  Sino-tubular junction:  3.7 cm  Ascending aorta:  4.7 cm, previously 4.5 cm.  Aortic arch:  3.5 cm.  Descending thoracic aorta: 2.6 cm.  There is no evidence of intramural hematoma or aortic dissection.  Great vessels are patent.  No evidence of abnormal mediastinal mass. Lungs are also grossly clear other than minimal dependent atelectasis.  IMPRESSION: Maximal diameter of the ascending thoracic aorta is 4.7 cm. Previously, this was measured at 4.5 cm. It has slightly increased. Ascending thoracic aortic aneurysm. Recommend semi-annual imaging followup by CTA or MRA and referral to cardiothoracic surgery if not already obtained. This recommendation follows 2010 ACCF/AHA/AATS/ACR/ASA/SCA/SCAI/SIR/STS/SVM Guidelines for the Diagnosis and Management of Patients With  Thoracic Aortic Disease. Circulation. 2010; 121: [id number]   Electronically Signed   By: Paul ClickArthur  Barrett M.D.   On: 01/14/2017 12:32 I personally reviewed the MR angiogram and concur with the findings noted above. The aneurysm has increased in size slightly.  Impression: Paul Barrett is a 61 year old man with an ascending aortic aneurysm. This was 4.5 cm on first discovered back in 2016. On today's scan it has increased slightly in size to 4.7 cm. There is no change in the morphology  of the aneurysm. It remains below threshold for surgical resection (greater than 5 mm change in 6 months or greater than 5.5 cm)  Hypertension- blood pressure well controlled with lisinopril  Hyperlipidemia- on simvastatin  Plan: Return in 6 months with CT angiogram of chest.  Loreli SlotSteven C Irvin Bastin, MD [facility] [phone]

## 2017-05-12 ENCOUNTER — Telehealth: Payer: Self-pay | Admitting: Family Medicine

## 2017-05-12 NOTE — Telephone Encounter (Signed)
Patients wife called to let Dr Dossie Arbour know that they have sent the information to the [employer] that they have been requesting. They also want to thank you for taking your time to call them.  They really appreciate it.  Just FYI

## 2017-06-25 ENCOUNTER — Other Ambulatory Visit: Payer: Self-pay | Admitting: *Deleted

## 2017-06-25 DIAGNOSIS — I712 Thoracic aortic aneurysm, without rupture, unspecified: Secondary | ICD-10-CM

## 2017-07-22 ENCOUNTER — Ambulatory Visit
Admission: RE | Admit: 2017-07-22 | Discharge: 2017-07-22 | Disposition: A | Discharge status: Home / Self Care | Payer: 59 | Source: Ambulatory Visit | Attending: Thoracic Surgery (Cardiothoracic Vascular Surgery) | Admitting: Thoracic Surgery (Cardiothoracic Vascular Surgery)

## 2017-07-22 ENCOUNTER — Ambulatory Visit: Payer: 59 | Admitting: Thoracic Surgery (Cardiothoracic Vascular Surgery)

## 2017-07-22 ENCOUNTER — Other Ambulatory Visit: Payer: Self-pay

## 2017-07-22 ENCOUNTER — Encounter: Payer: Self-pay | Admitting: Thoracic Surgery (Cardiothoracic Vascular Surgery)

## 2017-07-22 VITALS — BP 126/82 | HR 76 | Resp 18 | Ht 69.0 in | Wt 163.4 lb

## 2017-07-22 DIAGNOSIS — I712 Thoracic aortic aneurysm, without rupture, unspecified: Secondary | ICD-10-CM

## 2017-07-22 DIAGNOSIS — I7121 Aneurysm of the ascending aorta, without rupture: Secondary | ICD-10-CM

## 2017-07-22 MED ORDER — IOPAMIDOL (ISOVUE-370) INJECTION 76%
75.0000 mL | Freq: Once | INTRAVENOUS | Status: AC | PRN
Start: 2017-07-22 — End: 2017-07-22
  Administered 2017-07-22: 75 mL via INTRAVENOUS

## 2017-07-22 NOTE — Progress Notes (Signed)
[address]       [place] [id number]             [phone]    HPI: [name] returns for follow-up of an ascending aneurysm.  [name] is a 61 year old man with a past medical history significant for hypertension, hyperlipidemia, a left lower lobe lung nodule that resolved, hematuria, reflux, BPH, and a 4.5 cm ascending aneurysm.  His aneurysm was first noted on a CT in October 2016.  We have been following him every 6 months since then and it has remained unchanged at 4.5 cm.  It originally came to light when he was being evaluated for a 2.9 x 3.3 cm masslike opacity in the left lower lobe.  That completely resolved over serial scans.  I last saw him in June 2018.  He was doing well at that time.  In the interim since his last visit he is continued to do well.  He denies any chest pain, pressure, or tightness.  He denies shortness of breath.  He has not had any presyncope or syncope. Past Medical History:  Diagnosis Date  . Ascending aortic aneurysm (HCC)   . BPH (benign prostatic hyperplasia)   . GERD (gastroesophageal reflux disease)   . H/O hematuria   . Hearing loss   . Hemorrhoid   . Hyperglycemia   . Hyperlipidemia   . Hypertension   . Insomnia   . Lactose intolerance   . Lung nodule   . Pneumonia     Current Outpatient Medications  Medication Sig Dispense Refill  . aspirin 81 MG tablet Take 81 mg by mouth daily.    Marland Kitchen. levothyroxine (SYNTHROID, LEVOTHROID) 75 MCG tablet Take 75 mcg by mouth daily before breakfast.    . lisinopril (PRINIVIL,ZESTRIL) 5 MG tablet TAKE ONE (1) TABLET EACH DAY 30 tablet 0  . simvastatin (ZOCOR) 20 MG tablet Take 1 tablet (20 mg total) by mouth at bedtime. (Patient taking differently: Take 20 mg by mouth daily at 6 PM. ) 30 tablet 1   No current facility-administered medications for this visit.     Physical Exam BP 126/82 (BP Location: Right Arm, Patient Position: Sitting, Cuff Size: Large)   Pulse 76   Resp 18    Ht 5\' 9"  (1.753 m)   Wt 163 lb 6.4 oz (74.1 kg)   SpO2 98% Comment: on RA  BMI 24.5713 kg/m  61 year old man in no acute distress Alert and oriented x3 with no focal deficits, no carotid bruits Cardiac regular rate and rhythm normal S1 and S2, no murmur Lungs clear with equal breath sounds bilaterally   Diagnostic Tests: CT ANGIOGRAPHY CHEST WITH CONTRAST  TECHNIQUE: Multidetector CT imaging of the chest was performed using the standard protocol during bolus administration of intravenous contrast. Multiplanar CT image reconstructions and MIPs were obtained to evaluate the vascular anatomy.  CONTRAST:  75mL ISOVUE-370 IOPAMIDOL (ISOVUE-370) INJECTION 76%  COMPARISON:  MR 01/14/2017, CT 07/16/2016, MR 01/16/2016, CT 07/03/2015, 05/26/2015  FINDINGS: Cardiovascular:  Heart:  No cardiomegaly. No pericardial fluid/thickening. No significant coronary calcifications.  Aorta:  Unchanged course caliber and contour of the thoracic aorta, with similar diameter measured on the current CT angiogram to comparison studies. Greatest diameter of the ascending aorta measures 4.5 cm (image 63 of series 4), with no inflammatory changes or periaortic fluid. Diameter is unchanged dating to the CT 05/26/2015  Minimal calcifications of the aortic arch. Branch vessels remain patent. Proximal cervical vessels unremarkable.  Descending thoracic aorta unremarkable.  Pulmonary arteries:  No filling defects of the main pulmonary artery or the lobar pulmonary arteries. Beyond this the study is limited for the detection of pulmonary embolism.  Mediastinum/Nodes: Partially calcified lymph node of the right mediastinum is unchanged. No mediastinal adenopathy.  Unremarkable appearance of the thoracic inlet.  Lungs/Pleura: Unchanged 4 mm nodule of the right upper lobe, benign (image 56 of series 5). No confluent airspace disease. No endotracheal or endobronchial debris.  No  pneumothorax.  Upper Abdomen: No acute displaced fracture. Chronic deformity of the L1 vertebral body.  Musculoskeletal: No displaced fracture. Degenerative changes of the spine.  Review of the MIP images confirms the above findings.  IMPRESSION: Diameter of the ascending aorta has not changed since the first comparison CT of 05/26/2015, with diameter measuring approximately 4.5 cm on today's CT angiogram.  Aortic Atherosclerosis (ICD10-I70.0).  Signed,  Yvone NeuJaime S. Loreta AveWagner, DO  [facility]  [facility]   Electronically Signed   By: Gilmer MorJaime  Wagner D.O.   On: 07/22/2017 10:30 I personally reviewed the CT images and concur with the findings noted above.  Impression: [name] is a 61 year old gentleman with a 4.5 cm ascending aneurysm that has been stable over the past 2 years.  There is no indication at this time.  He does need continued follow-up at six-month intervals.  Hypertension-blood pressure well controlled on current regimen.  Hyperlipidemia-on Zocor.  Right upper lobe nodule-4 mm diameter.  Stable over past 2 years.  Consistent with benign nodule.  Plan: Return in 6 months with MR angiogram  Loreli SlotSteven C Caidyn Blossom, MD [facility] [phone]

## 2017-12-26 ENCOUNTER — Other Ambulatory Visit: Payer: Self-pay | Admitting: Thoracic Surgery (Cardiothoracic Vascular Surgery)

## 2017-12-26 DIAGNOSIS — I712 Thoracic aortic aneurysm, without rupture, unspecified: Secondary | ICD-10-CM

## 2018-01-20 ENCOUNTER — Ambulatory Visit: Payer: 59 | Admitting: Thoracic Surgery (Cardiothoracic Vascular Surgery)

## 2018-01-20 ENCOUNTER — Encounter: Payer: Self-pay | Admitting: Thoracic Surgery (Cardiothoracic Vascular Surgery)

## 2018-01-20 ENCOUNTER — Ambulatory Visit
Admission: RE | Admit: 2018-01-20 | Discharge: 2018-01-20 | Disposition: A | Discharge status: Home / Self Care | Payer: 59 | Source: Ambulatory Visit | Attending: Thoracic Surgery (Cardiothoracic Vascular Surgery) | Admitting: Thoracic Surgery (Cardiothoracic Vascular Surgery)

## 2018-01-20 VITALS — BP 125/67 | HR 80 | Resp 20 | Ht 69.0 in | Wt 158.0 lb

## 2018-01-20 DIAGNOSIS — I712 Thoracic aortic aneurysm, without rupture, unspecified: Secondary | ICD-10-CM

## 2018-01-20 DIAGNOSIS — I7121 Aneurysm of the ascending aorta, without rupture: Secondary | ICD-10-CM

## 2018-01-20 MED ORDER — GADOBENATE DIMEGLUMINE 529 MG/ML IV SOLN
15.0000 mL | Freq: Once | INTRAVENOUS | Status: AC | PRN
  Administered 2018-01-20: 15 mL via INTRAVENOUS

## 2018-01-20 NOTE — Progress Notes (Signed)
[address]       Paul Barrett [address]             [phone]     HPI: Paul Barrett returns for follow-up of his ascending aneurysm.  Paul Barrett is a 62 year old gentleman with a past history of hypertension, hyperlipidemia, left lower lobe lung nodule (resolved), hematuria, reflux, benign prostatic hypertrophy, and a 4.5 cm ascending aneurysm.  In October 2016 he had a left lower lobe pneumonia.  On CT there was a 2.9 x 3.3 cm masslike opacity in the lower lobe.  A follow-up CT in November showed resolution of the pneumonia.  He was noted to have an ascending aneurysm at 4.5 cm.  I have followed him since then.  In the interim since his last visit has been doing well.  He remains active.  He has not had any chest pain, pressure, or tightness.  He denies shortness of breath with exertion.  Past Medical History:  Diagnosis Date  . Ascending aortic aneurysm (HCC)   . BPH (benign prostatic hyperplasia)   . GERD (gastroesophageal reflux disease)   . H/O hematuria   . Hearing loss   . Hemorrhoid   . Hyperglycemia   . Hyperlipidemia   . Hypertension   . Insomnia   . Lactose intolerance   . Lung nodule   . Pneumonia     Current Outpatient Medications  Medication Sig Dispense Refill  . aspirin 81 MG tablet Take 81 mg by mouth daily.    Marland Kitchen levothyroxine (SYNTHROID, LEVOTHROID) 75 MCG tablet Take 75 mcg by mouth daily before breakfast.    . lisinopril (PRINIVIL,ZESTRIL) 5 MG tablet TAKE ONE (1) TABLET EACH DAY 30 tablet 0  . simvastatin (ZOCOR) 20 MG tablet Take 1 tablet (20 mg total) by mouth at bedtime. (Patient taking differently: Take 20 mg by mouth daily at 6 PM. ) 30 tablet 1   No current facility-administered medications for this visit.     Physical Exam BP 125/67   Pulse 80   Resp 20   Ht 5\' 9"  (1.753 m)   Wt 158 lb (71.7 kg)   BMI 23.9 kg/m  62 year old man in no acute distress Alert and oriented x3 with no focal deficits Well-developed and  well-nourished No carotid bruits Cardiac regular rate and rhythm normal S1 and S2 with no rubs or murmurs Lungs clear with equal breath sounds bilaterally Pulses intact bilaterally  Diagnostic Tests: MRA CHEST WITH OR WITHOUT CONTRAST  TECHNIQUE: Angiographic images of the chest were obtained using MRA technique with intravenous contrast.  CONTRAST:  15mL MULTIHANCE GADOBENATE DIMEGLUMINE 529 MG/ML IV SOLN  Creatinine was obtained on site at [facility] at [address]  Results: Creatinine 1.0 mg/dL.  BUN 22.  GFR 81.  COMPARISON:  CT 07/22/2017 and previous  FINDINGS: VASCULAR  Aorta: No dissection or stenosis. Transverse diameters as follows: 4.6 cm sinuses of Valsalva  3.7 cm sino-tubular junction  4.5 cm mid ascending (stable since prior study)  3.7 cm distal ascending/proximal arch  2.3 cm distal arch  2.5 cm proximal descending  2.2 cm distal descending  Classic 3 vessel brachiocephalic arterial origin anatomy without proximal stenosis. No significant atheromatous irregularity. Visualized proximal abdominal aorta unremarkable.  Heart: Normal in size, unremarkable  Pulmonary Arteries: Widely patent centrally. Segmental and subsegmental branches incompletely evaluated.  Other: No pleural or pericardial effusion.  NON-VASCULAR  Spinal cord: Negative limited evaluation  Brachial plexus: Negative  Muscles  and tendons: Negative  Bones: Negative  Joints: Negative limited evaluation  Upper abdomen: No acute findings  IMPRESSION: VASCULAR  1. Stable 4.5 cm ascending thoracic aortic aneurysm without complicating features.  NON-VASCULAR  1. No acute findings.   Electronically Signed   By: Corlis Leak  Hassell M.D.   On: 01/20/2018 10:59 I personally reviewed the MR images and concur with the findings noted above  Impression: Paul Barrett is a 62 year old man with a 4.5 cm ascending aneurysm.  This has  remained stable since first noted in October 2016.  He needs continued semiannual follow-up.  We will plan to do a CT in 6 months.  Hypertension-blood pressure well controlled on lisinopril.  Hyperlipidemia-on simvastatin  Lung nodules-stable 4 mm nodule in right lower lobe.  Left lower lobe nodule resolved.  Plan: Return in 6 months with CT Angio of chest  Paul SlotSteven C Maythe Deramo, MD [facility] [phone]

## 2018-06-12 ENCOUNTER — Other Ambulatory Visit: Payer: Self-pay | Admitting: Thoracic Surgery (Cardiothoracic Vascular Surgery)

## 2018-06-12 DIAGNOSIS — I712 Thoracic aortic aneurysm, without rupture, unspecified: Secondary | ICD-10-CM

## 2018-08-04 ENCOUNTER — Ambulatory Visit: Payer: 59 | Admitting: Thoracic Surgery (Cardiothoracic Vascular Surgery)

## 2018-08-04 ENCOUNTER — Encounter: Payer: Self-pay | Admitting: Thoracic Surgery (Cardiothoracic Vascular Surgery)

## 2018-08-04 ENCOUNTER — Ambulatory Visit
Admission: RE | Admit: 2018-08-04 | Discharge: 2018-08-04 | Disposition: A | Discharge status: Home / Self Care | Payer: 59 | Source: Ambulatory Visit | Attending: Thoracic Surgery (Cardiothoracic Vascular Surgery) | Admitting: Thoracic Surgery (Cardiothoracic Vascular Surgery)

## 2018-08-04 VITALS — BP 128/78 | HR 80 | Resp 20 | Ht 69.0 in | Wt 167.0 lb

## 2018-08-04 DIAGNOSIS — I712 Thoracic aortic aneurysm, without rupture, unspecified: Secondary | ICD-10-CM

## 2018-08-04 DIAGNOSIS — I7121 Aneurysm of the ascending aorta, without rupture: Secondary | ICD-10-CM

## 2018-08-04 DIAGNOSIS — R911 Solitary pulmonary nodule: Secondary | ICD-10-CM

## 2018-08-04 MED ORDER — IOPAMIDOL (ISOVUE-370) INJECTION 76%
75.0000 mL | Freq: Once | INTRAVENOUS | Status: AC | PRN
  Administered 2018-08-04: 75 mL via INTRAVENOUS

## 2018-08-04 NOTE — Progress Notes (Signed)
[address]       [place] [id number]             [phone]     HPI: [name] returns for a scheduled follow-up visit regarding his ascending aneurysm  Paul Barrett is a 62 year old man with a history of hypertension, hyperlipidemia, reflux, BPH, hematuria, and a 4.5 cm ascending aneurysm.  His aneurysm was first described covered in November 2016.  He had pneumonia the month before.  There was a masslike opacity in the lower lobe.  CT showed his ascending aneurysm was 4.5 cm.  The lung opacity resolved over time and we have continue to follow him for his aneurysm.  He has been feeling well.  He is not having any chest pain, pressure, tightness, or shortness of breath at rest or with exertion.  He remains physically active.  Past Medical History:  Diagnosis Date  . Ascending aortic aneurysm (HCC)   . BPH (benign prostatic hyperplasia)   . GERD (gastroesophageal reflux disease)   . H/O hematuria   . Hearing loss   . Hemorrhoid   . Hyperglycemia   . Hyperlipidemia   . Hypertension   . Insomnia   . Lactose intolerance   . Lung nodule   . Pneumonia     Current Outpatient Medications  Medication Sig Dispense Refill  . aspirin 81 MG tablet Take 81 mg by mouth daily.    Marland Kitchen. levothyroxine (SYNTHROID, LEVOTHROID) 75 MCG tablet Take 75 mcg by mouth daily before breakfast.    . lisinopril (PRINIVIL,ZESTRIL) 5 MG tablet TAKE ONE (1) TABLET EACH DAY 30 tablet 0  . simvastatin (ZOCOR) 20 MG tablet Take 1 tablet (20 mg total) by mouth at bedtime. (Patient taking differently: Take 20 mg by mouth daily at 6 PM. ) 30 tablet 1   No current facility-administered medications for this visit.     Physical Exam BP 128/78   Pulse 80   Resp 20   Ht 5\' 9"  (1.753 m)   Wt 167 lb (75.8 kg)   SpO2 98% Comment: RA  BMI 24.3166 kg/m  62 year old man in no acute distress Alert and oriented x3 with no focal deficit No carotid bruits Cardiac regular rate and rhythm with normal  S1 and S2 with no rubs, murmurs or gallops Lungs clear with equal breath sounds bilaterally  Diagnostic Tests: CT ANGIOGRAPHY CHEST WITH CONTRAST  TECHNIQUE: Multidetector CT imaging of the chest was performed using the standard protocol during bolus administration of intravenous contrast. Multiplanar CT image reconstructions and MIPs were obtained to evaluate the vascular anatomy.  CONTRAST:  75mL ISOVUE-370 IOPAMIDOL (ISOVUE-370) INJECTION 76%  [facility] at [address]  Results: Creatinine 1.0 mg/dL.  GFR of 80.  COMPARISON:  MR angio of 01/20/2017 and CT chest of 07/22/2017  FINDINGS: Cardiovascular: The fusiform dilatation of the ascending thoracic aorta to the proximal aspect of the arch is stable. The maximum diameter at the level of the main pulmonary artery is again 45 mm which is stable compared to the prior CT. Heart size is stable and no coronary artery calcifications are evident. The pulmonary arteries opacify with no central abnormality noted.  Mediastinum/Nodes: No mediastinal or hilar adenopathy is seen. The thyroid gland is unremarkable. The thoracic esophagus is unremarkable as well.  Lungs/Pleura: On lung window images, only the tiny 3 mm nodule adjacent to a vascular bifurcation in the right upper lobe is noted  on image 69 series 7 and this small noncalcified nodule has not changed. No additional lung nodule is seen. No pneumonia is noted and there is no evidence of pleural effusion. The central airway is patent.  Upper Abdomen: The portion of the upper abdomen that is visualized is unremarkable.  Musculoskeletal: The thoracic vertebrae are normal alignment with only mild degenerative change present. The ribs appear intact.  Review of the MIP images confirms the above findings.  IMPRESSION: 1. Stable fusiform dilatation of the ascending thoracic aorta measuring 4.5 cm in maximum  diameter, unchanged compared to the prior CT of 07/22/2017. Recommend annual imaging followup by CTA or MRA. This recommendation follows 2010 ACCF/AHA/AATS/ACR/ASA/SCA/SCAI/SIR/STS/SVM Guidelines for the Diagnosis and Management of Patients with Thoracic Aortic Disease. Circulation. 2010; 121: [id number] 2. Stable tiny 3 mm nodule in the right upper lobe. No additional lung nodule is seen.   Electronically Signed   By: Dwyane DeePaul  Barry M.D.   On: 08/04/2018 10:38 I personally reviewed the CT images and concur with the findings noted above  Impression: Paul Barrett is a 62 year old gentleman with a history of a 4.5 cm ascending aneurysm, hypertension, hyperlipidemia, reflux, hematuria, and BPH.  He was first noted to have a 4.5 cm ascending aneurysm back in 2016.  We have been following him every 6 months since then.  There is been no interval change.  We will plan to do a another CT in 6 months.  Hypertension-blood pressure well controlled on current regimen  Hyperlipidemia-on Zocor  Plan: Return in 6 months with CT Angio of chest  Paul SlotSteven C Tanav Orsak, MD [facility] [phone]

## 2018-11-12 ENCOUNTER — Other Ambulatory Visit: Payer: Self-pay | Admitting: *Deleted

## 2018-11-12 DIAGNOSIS — I712 Thoracic aortic aneurysm, without rupture, unspecified: Secondary | ICD-10-CM

## 2019-01-22 ENCOUNTER — Ambulatory Visit
Admission: RE | Admit: 2019-01-22 | Discharge: 2019-01-22 | Disposition: A | Discharge status: Home / Self Care | Payer: 59 | Source: Ambulatory Visit | Attending: Thoracic Surgery (Cardiothoracic Vascular Surgery) | Admitting: Thoracic Surgery (Cardiothoracic Vascular Surgery)

## 2019-01-22 DIAGNOSIS — I712 Thoracic aortic aneurysm, without rupture, unspecified: Secondary | ICD-10-CM

## 2019-01-22 MED ORDER — IOPAMIDOL (ISOVUE-370) INJECTION 76%
75.0000 mL | Freq: Once | INTRAVENOUS | Status: AC | PRN
  Administered 2019-01-22: 75 mL via INTRAVENOUS

## 2019-01-26 ENCOUNTER — Encounter: Payer: Self-pay | Admitting: Thoracic Surgery (Cardiothoracic Vascular Surgery)

## 2019-01-26 ENCOUNTER — Ambulatory Visit: Payer: 59 | Admitting: Thoracic Surgery (Cardiothoracic Vascular Surgery)

## 2019-01-26 ENCOUNTER — Other Ambulatory Visit: Payer: Self-pay

## 2019-01-26 VITALS — BP 116/80 | HR 78 | Temp 97.7°F | Resp 16 | Ht 69.0 in | Wt 163.0 lb

## 2019-01-26 DIAGNOSIS — I7121 Aneurysm of the ascending aorta, without rupture: Secondary | ICD-10-CM

## 2019-01-26 DIAGNOSIS — R911 Solitary pulmonary nodule: Secondary | ICD-10-CM | POA: Diagnosis not present

## 2019-01-26 DIAGNOSIS — I712 Thoracic aortic aneurysm, without rupture: Secondary | ICD-10-CM | POA: Diagnosis not present

## 2019-01-26 NOTE — Progress Notes (Signed)
[address]       [place] [address]             [phone]     HPI: Mr. Borba returns for follow-up of his ascending aneurysm.  Paul Barrett is a 63 year old gentleman with a known 4.5 cm ascending aneurysm.  He also has a past medical history significant for hypertension, hyperlipidemia, lung nodule, reflux, BPH, and lactose intolerance.  He was first found to have an ascending aneurysm in 2016.  He had a pneumonia.  There was a 3 cm masslike opacity in the lower lobe.  CT also showed a 4.5 cm ascending aneurysm.  The lower lobe opacity resolved on subsequent scans.  We have continue to follow his ascending aneurysm.  In the interim since his last visit 6 months ago he has been feeling well.  He denies any chest pain, pressure, or tightness.  No shortness of breath with exertion.  Past Medical History:  Diagnosis Date  . Ascending aortic aneurysm ([facility])   . BPH (benign prostatic hyperplasia)   . GERD (gastroesophageal reflux disease)   . H/O hematuria   . Hearing loss   . Hemorrhoid   . Hyperglycemia   . Hyperlipidemia   . Hypertension   . Insomnia   . Lactose intolerance   . Lung nodule   . Pneumonia     Current Outpatient Medications  Medication Sig Dispense Refill  . aspirin 81 MG tablet Take 81 mg by mouth daily.    Marland Kitchen levothyroxine (SYNTHROID, LEVOTHROID) 75 MCG tablet Take 75 mcg by mouth daily before breakfast.    . lisinopril (PRINIVIL,ZESTRIL) 5 MG tablet TAKE ONE (1) TABLET EACH DAY 30 tablet 0  . simvastatin (ZOCOR) 20 MG tablet Take 1 tablet (20 mg total) by mouth at bedtime. (Patient taking differently: Take 20 mg by mouth daily at 6 PM. ) 30 tablet 1   No current facility-administered medications for this visit.     Physical Exam BP 116/80 (BP Location: Right Arm, Patient Position: Sitting, Cuff Size: Normal)   Pulse 78   Temp 97.7 F (36.5 C) (Other (Comment)) Comment (Src): thermal  Resp 16   Ht 5\' 9"  (1.753 m)   Wt 163 lb  (73.9 kg)   SpO2 97% Comment: RA  BMI 24.52 kg/m  63 year old man in no acute distress Alert and oriented x3 with no focal deficits No carotid bruits Cardiac regular rate and rhythm normal S1 and S2, no rubs, murmurs or gallops Lungs clear with equal breath sounds bilaterally No peripheral edema Peripheral pulses 2+ and symmetric bilaterally  Diagnostic Tests: CT ANGIOGRAPHY CHEST WITH CONTRAST  TECHNIQUE: Multidetector CT imaging of the chest was performed using the standard protocol during bolus administration of intravenous contrast. Multiplanar CT image reconstructions and MIPs were obtained to evaluate the vascular anatomy.  CONTRAST:  67mL ISOVUE-370 IOPAMIDOL (ISOVUE-370) INJECTION 76%  Creatinine was obtained on site at [facility] at [address]  Results: Creatinine 0.9 mg/dL.  Estimated GFR 91 mL/minute  COMPARISON:  08/04/2018  FINDINGS: Cardiovascular: The aortic root is normal in caliber and measures 3.9 cm at the level of the sinuses of Valsalva. The ascending thoracic aorta demonstrates stable aneurysmal dilatation measuring 4.4 cm in greatest diameter. The proximal arch measures 3.1 cm and the distal arch 2.6 cm. Proximal great vessels are normally patent and demonstrate normal branching anatomy. The descending thoracic aorta measures 2.3 cm. No evidence of aortic dissection. The heart size  is normal. No pericardial fluid identified. No significant calcified coronary artery plaque. Central pulmonary arteries are normal in caliber.  Mediastinum/Nodes: No enlarged mediastinal, hilar, or axillary lymph nodes. Stable calcified nodes in the right lower paratracheal region. Thyroid gland, trachea, and esophagus demonstrate no significant findings.  Lungs/Pleura: Stable 4 mm right upper lobe lung nodule. There is no evidence of pulmonary edema, consolidation, pneumothorax or pleural fluid.  Upper Abdomen: No acute abnormality.   Musculoskeletal: No chest wall abnormality. No acute or significant osseous findings.  Review of the MIP images confirms the above findings.  IMPRESSION: 1. Stable aneurysmal disease of the ascending thoracic aorta measuring 4.4 cm in greatest diameter. 2. Stable 4 mm right upper lobe lung nodule.  Aortic aneurysm NOS (ICD10-I71.9).   Electronically Signed   By: Irish LackGlenn  Yamagata M.D.   On: 01/22/2019 13:56 I personally reviewed the CT images and concur with the findings noted above  Impression: Paul SimsDavid Stohr is a 63 year old gentleman with a history of hypertension, hyperlipidemia, pneumonia, BPH, and a 4.5 cm ascending aneurysm.  Ascending aneurysm-stable at 4.5 cm.  Needs continued semiannual follow-up.  Hypertension-pressure well controlled on current regimen.  Right upper lobe lung nodule-stable at 4 mm.  Non-smoker.  Almost certainly benign nodule.  Will follow on CT with his aneurysm.  Plan: Return in 6 months with CT angiogram of chest  Loreli SlotSteven C Naileah Karg, MD [facility] [phone]

## 2019-06-22 ENCOUNTER — Other Ambulatory Visit: Payer: Self-pay | Admitting: *Deleted

## 2019-06-22 DIAGNOSIS — I712 Thoracic aortic aneurysm, without rupture, unspecified: Secondary | ICD-10-CM

## 2019-07-21 ENCOUNTER — Ambulatory Visit
Admission: RE | Admit: 2019-07-21 | Discharge: 2019-07-21 | Disposition: A | Discharge status: Home / Self Care | Payer: 59 | Source: Ambulatory Visit | Attending: Thoracic Surgery (Cardiothoracic Vascular Surgery) | Admitting: Thoracic Surgery (Cardiothoracic Vascular Surgery)

## 2019-07-21 DIAGNOSIS — I712 Thoracic aortic aneurysm, without rupture, unspecified: Secondary | ICD-10-CM

## 2019-07-21 MED ORDER — IOPAMIDOL (ISOVUE-370) INJECTION 76%
75.0000 mL | Freq: Once | INTRAVENOUS | Status: AC | PRN
  Administered 2019-07-21: 13:00:00 75 mL via INTRAVENOUS

## 2019-07-27 ENCOUNTER — Other Ambulatory Visit: Payer: Self-pay

## 2019-07-27 ENCOUNTER — Telehealth: Payer: 59 | Admitting: Thoracic Surgery (Cardiothoracic Vascular Surgery)

## 2019-07-27 ENCOUNTER — Ambulatory Visit: Payer: 59 | Admitting: Thoracic Surgery (Cardiothoracic Vascular Surgery)

## 2019-07-27 NOTE — Progress Notes (Unsigned)
This encounter was created in error - please disregard.

## 2019-07-28 ENCOUNTER — Telehealth: Payer: 59 | Admitting: Thoracic Surgery (Cardiothoracic Vascular Surgery)

## 2019-07-28 DIAGNOSIS — I712 Thoracic aortic aneurysm, without rupture: Secondary | ICD-10-CM | POA: Diagnosis not present

## 2019-07-28 DIAGNOSIS — I7121 Aneurysm of the ascending aorta, without rupture: Secondary | ICD-10-CM

## 2019-07-28 NOTE — Progress Notes (Signed)
[address]       [name] [address]             [phone]      This is a 26-month follow-up visit for Mr. Dequane Strahan. [place].  This visit was conducted by telephone after in person visit was canceled due to emergency.  2 identifiers used.  Lelon Ikard is a 63 year old man with a past medical history significant for hypertension, hyperlipidemia, lung nodule, reflux, benign prostatic hypertrophy, lactose intolerance, and a 4.5 cm ascending aortic aneurysm.  Back in 2016 he had pneumonia.  He had a chest x-ray and CT which showed a 3 cm masslike opacity in the right lower lobe.  He also was noted to have a 4.5 cm ascending aneurysm.  Lower lobe opacity resolved on subsequent scans.  We continue to follow him for his aneurysm.  I last saw him in the office in June 2020.  He was doing well at that time with no significant issues.  He feels well.  He is not having any chest pain, pressure, or tightness.  He denies shortness of breath.  He has not been checking his blood pressure on a regular basis at home.  CT ANGIOGRAPHY CHEST WITH CONTRAST  TECHNIQUE: Multidetector CT imaging of the chest was performed using the standard protocol during bolus administration of intravenous contrast. Multiplanar CT image reconstructions and MIPs were obtained to evaluate the vascular anatomy.  CONTRAST:  3mL ISOVUE-370 IOPAMIDOL (ISOVUE-370) INJECTION 76%  COMPARISON:  01/22/2019  FINDINGS: Cardiovascular: Ascending thoracic aortic aneurysm again noted measuring 4.4 cm in greatest diameter within the ascending thoracic aorta, stable since prior study. Scattered aortic atherosclerosis. Heart is normal size.  Mediastinum/Nodes: No mediastinal, hilar, or axillary adenopathy. Trachea and esophagus are unremarkable. Thyroid unremarkable.  Lungs/Pleura: Biapical scarring. Calcified small left upper lobe nodule/granuloma. No suspicious pulmonary nodules. No  confluent opacities or effusions.  Upper Abdomen: Imaging into the upper abdomen shows no acute findings.  Musculoskeletal: Mild compression through the superior endplate of L1, stable. No acute bony abnormality. Chest wall soft tissues are unremarkable.  Review of the MIP images confirms the above findings.   IMPRESSION: Stable 4.4 cm ascending thoracic aortic aneurysm. Recommend annual imaging followup by CTA or MRA. This recommendation follows 2010 ACCF/AHA/AATS/ACR/ASA/SCA/SCAI/SIR/STS/SVM Guidelines for the Diagnosis and Management of Patients with Thoracic Aortic Disease. Circulation. 2010; 121: Z329-J242. Aortic aneurysm NOS (ICD10-I71.9)  Small calcified left upper lobe granuloma. No suspicious pulmonary nodules currently. Biapical scarring. Previously described right upper lobe nodule not visualized.   Electronically Signed   By: Charlett Nose M.D.   On: 07/21/2019 13:20 I personally reviewed the CT images.  I measured the aneurysm at 4.5 cm.  Impression Jaelen Soth is a 63 year old man with a history of hypertension, hyperlipidemia, reflux, BPH, and a 4.5 cm ascending aneurysm.  He had a 3 cm opacity in his right lower lobe back in 2016 but that was associated with pneumonia and resolved to see improved.  No suspicious lung nodules on today's scan.  Ascending aneurysm-stable at 4.5 cm.  Recommended continued semiannual follow-up.  Hypertension-he is on medication for that.  I recommended that he check himself at home once a week or so just to make sure that his blood pressure remains in the recommended range with a systolic of less than 140 and ideally less than 130.  Plan  I will see him back in 6 months with a CT angiogram of the chest.  Revonda Standard Roxan Hockey, MD [facility] [phone]

## 2019-12-20 ENCOUNTER — Other Ambulatory Visit: Payer: Self-pay | Admitting: *Deleted

## 2019-12-20 DIAGNOSIS — I712 Thoracic aortic aneurysm, without rupture, unspecified: Secondary | ICD-10-CM

## 2019-12-20 NOTE — Progress Notes (Unsigned)
c 

## 2020-01-31 ENCOUNTER — Other Ambulatory Visit: Payer: Self-pay | Admitting: Thoracic Surgery (Cardiothoracic Vascular Surgery)

## 2020-02-03 ENCOUNTER — Ambulatory Visit
Admission: RE | Admit: 2020-02-03 | Discharge: 2020-02-03 | Disposition: A | Discharge status: Home / Self Care | Payer: 59 | Source: Ambulatory Visit | Attending: Thoracic Surgery (Cardiothoracic Vascular Surgery) | Admitting: Thoracic Surgery (Cardiothoracic Vascular Surgery)

## 2020-02-03 DIAGNOSIS — I712 Thoracic aortic aneurysm, without rupture, unspecified: Secondary | ICD-10-CM

## 2020-02-03 MED ORDER — IOPAMIDOL (ISOVUE-370) INJECTION 76%
75.0000 mL | Freq: Once | INTRAVENOUS | Status: AC | PRN
  Administered 2020-02-03: 75 mL via INTRAVENOUS

## 2020-02-08 ENCOUNTER — Encounter: Payer: Self-pay | Admitting: Thoracic Surgery (Cardiothoracic Vascular Surgery)

## 2020-02-08 ENCOUNTER — Other Ambulatory Visit: Payer: Self-pay

## 2020-02-08 ENCOUNTER — Other Ambulatory Visit: Payer: 59

## 2020-02-08 ENCOUNTER — Ambulatory Visit: Payer: 59 | Admitting: Thoracic Surgery (Cardiothoracic Vascular Surgery)

## 2020-02-08 VITALS — BP 136/79 | HR 75 | Temp 97.6°F | Resp 20 | Ht 69.0 in | Wt 165.0 lb

## 2020-02-08 DIAGNOSIS — I712 Thoracic aortic aneurysm, without rupture: Secondary | ICD-10-CM

## 2020-02-08 DIAGNOSIS — I7121 Aneurysm of the ascending aorta, without rupture: Secondary | ICD-10-CM

## 2020-02-08 NOTE — Progress Notes (Signed)
[address]       [name] [address]             [phone]       HPI: [name] returns for a scheduled follow-up of an ascending aneurysm.  Chet Greenley is a 64 year old male with a history of hypertension, hyperlipidemia, reflux, benign prostatic hypertrophy, lactose intolerance, and a 4.5 cm ascending aneurysm.  In 2016 he had pneumonia.  A chest x-ray and CT showed a 3 cm masslike opacity in the right lower lobe.  Incidentally he was noted to have a 4.5 cm ascending aneurysm.  Lower lobe opacity resolved on follow-up scans.  He has been followed for his aneurysm.  In the interim since his last visit he has been feeling well.  He denies any chest pain, pressure or tightness, shortness of breath, or peripheral edema.  His blood pressure has been well controlled with lisinopril.  Past Medical History:  Diagnosis Date  . Ascending aortic aneurysm ([facility])   . BPH (benign prostatic hyperplasia)   . GERD (gastroesophageal reflux disease)   . H/O hematuria   . Hearing loss   . Hemorrhoid   . Hyperglycemia   . Hyperlipidemia   . Hypertension   . Insomnia   . Lactose intolerance   . Lung nodule   . Pneumonia     Current Outpatient Medications  Medication Sig Dispense Refill  . aspirin 81 MG tablet Take 81 mg by mouth daily.    Marland Kitchen levothyroxine (SYNTHROID, LEVOTHROID) 75 MCG tablet Take 75 mcg by mouth daily before breakfast.    . lisinopril (PRINIVIL,ZESTRIL) 5 MG tablet TAKE ONE (1) TABLET EACH DAY 30 tablet 0  . simvastatin (ZOCOR) 20 MG tablet Take 1 tablet (20 mg total) by mouth at bedtime. (Patient taking differently: Take 20 mg by mouth daily at 6 PM. ) 30 tablet 1   No current facility-administered medications for this visit.    Physical Exam BP 136/79   Pulse 75   Temp 97.6 F (36.4 C) (Skin)   Resp 20   Ht 5\' 9"  (1.753 m)   Wt 165 lb (74.8 kg)   SpO2 95% Comment: RA  BMI 24.19 kg/m  64 year old man in no acute distress Alert and  oriented x3 with no focal deficits Lungs clear with equal breath sounds bilaterally No carotid bruits Cardiac regular rate and rhythm normal S1 and S2 with no rubs, murmurs or gallops Peripheral edema Peripheral pulses intact  Diagnostic Tests: CT ANGIOGRAPHY CHEST WITH CONTRAST  TECHNIQUE: Multidetector CT imaging of the chest was performed using the standard protocol during bolus administration of intravenous contrast. Multiplanar CT image reconstructions and MIPs were obtained to evaluate the vascular anatomy.  CONTRAST:  78mL ISOVUE-370 IOPAMIDOL (ISOVUE-370) INJECTION 76%  COMPARISON:  CT scans 08/04/2018, 01/22/2019, 07/21/2019  FINDINGS: Cardiovascular: The heart is normal in size. No pericardial effusion. Fusiform aneurysmal dilatation of the ascending thoracic aorta is again demonstrated. Maximum diameter is 44.5 mm. This appears relatively stable when compared to prior exams. No aortic dissection. Scattered atherosclerotic calcifications mainly at the aortic arch. The descending thoracic aorta is normal in caliber.  The pulmonary arteries are normal.  Mediastinum/Nodes: No mediastinal or hilar mass or adenopathy. The esophagus is grossly normal.  Lungs/Pleura: Stable biapical pleural and parenchymal scarring type changes. No worrisome pulmonary lesions or acute pulmonary findings. No pleural effusion. No pleural lesions.  Upper Abdomen: The upper abdomen is unremarkable. Scattered aortic calcifications.  Musculoskeletal: No  significant bony findings.  Review of the MIP images confirms the above findings.  IMPRESSION: 1. Stable fusiform aneurysmal dilatation of the ascending thoracic aorta with a maximum diameter of 44.5 mm. No dissection. 2. No acute pulmonary findings or worrisome pulmonary lesions. 3. No mediastinal or hilar mass or adenopathy.  Aortic Atherosclerosis (ICD10-I70.0).  Aortic aneurysm NOS (ICD10-I71.9).   Electronically  Signed   By: Rudie Meyer M.D.   On: 02/03/2020 10:55 I personally reviewed the CT images and concur with the findings noted above  Impression: Zackariah Vanderpol is a 64 year old man with a history of hypertension, hyperlipidemia, reflux, benign prostatic hypertrophy, lactose intolerance, and a 4.5 cm ascending aneurysm.  Ascending thoracic aortic aneurysm/aortic atherosclerosis-stable at 4.5 cm.  Needs continued semiannual follow-up.  Aware of importance of blood pressure control.  Hypertension-blood pressure control good on current regimen with lisinopril.  Old calcified granuloma in left lung.  No suspicious lung nodules.  Plan: Return in 6 months with MR angio of chest  Loreli Slot, MD [facility] [phone]

## 2020-06-16 ENCOUNTER — Other Ambulatory Visit: Payer: Self-pay | Admitting: Thoracic Surgery (Cardiothoracic Vascular Surgery)

## 2020-06-16 DIAGNOSIS — I712 Thoracic aortic aneurysm, without rupture, unspecified: Secondary | ICD-10-CM

## 2020-07-05 ENCOUNTER — Other Ambulatory Visit: Payer: Self-pay | Admitting: Thoracic Surgery (Cardiothoracic Vascular Surgery)

## 2020-07-05 DIAGNOSIS — I712 Thoracic aortic aneurysm, without rupture, unspecified: Secondary | ICD-10-CM

## 2020-08-01 ENCOUNTER — Ambulatory Visit
Admission: RE | Admit: 2020-08-01 | Discharge: 2020-08-01 | Disposition: A | Discharge status: Home / Self Care | Payer: 59 | Source: Ambulatory Visit | Attending: Thoracic Surgery (Cardiothoracic Vascular Surgery) | Admitting: Thoracic Surgery (Cardiothoracic Vascular Surgery)

## 2020-08-01 ENCOUNTER — Encounter: Payer: Self-pay | Admitting: Thoracic Surgery (Cardiothoracic Vascular Surgery)

## 2020-08-01 ENCOUNTER — Other Ambulatory Visit: Payer: Self-pay

## 2020-08-01 ENCOUNTER — Ambulatory Visit: Payer: 59 | Admitting: Thoracic Surgery (Cardiothoracic Vascular Surgery)

## 2020-08-01 VITALS — BP 124/84 | HR 86 | Resp 20 | Ht 69.0 in | Wt 162.0 lb

## 2020-08-01 DIAGNOSIS — I712 Thoracic aortic aneurysm, without rupture, unspecified: Secondary | ICD-10-CM

## 2020-08-01 DIAGNOSIS — I7121 Aneurysm of the ascending aorta, without rupture: Secondary | ICD-10-CM

## 2020-08-01 MED ORDER — IOPAMIDOL (ISOVUE-300) INJECTION 61%
75.0000 mL | Freq: Once | INTRAVENOUS | Status: AC | PRN
  Administered 2020-08-01: 75 mL via INTRAVENOUS

## 2020-08-01 NOTE — Progress Notes (Signed)
[address]       [name] [address]             [phone]      HPI: [name] returns for follow-up of his ascending aneurysm  Paul Barrett is a 64 year old man with a history of hypertension, hyperlipidemia, reflux, benign prostatic hypertrophy, lactose intolerance, pneumonia, granuloma, and a 4.5 cm ascending aneurysm.  Paul Barrett was first found to have an ascending aneurysm on a CT that was done back in 2016.  At that time there was a 3 cm masslike opacity in the right lower lobe.  That resolved on subsequent follow-up scans.  Paul Barrett has been followed for his aneurysm ever since.  In the interim since his last visit Paul Barrett has been doing well.  Paul Barrett is not having any chest pain, pressure, tightness, or shortness of breath.  Some limitation to physical activity due to knee pain.  Overall feels well.  Past Medical History:  Diagnosis Date  . Ascending aortic aneurysm ([facility])   . BPH (benign prostatic hyperplasia)   . GERD (gastroesophageal reflux disease)   . H/O hematuria   . Hearing loss   . Hemorrhoid   . Hyperglycemia   . Hyperlipidemia   . Hypertension   . Insomnia   . Lactose intolerance   . Lung nodule   . Pneumonia     Current Outpatient Medications  Medication Sig Dispense Refill  . aspirin 81 MG tablet Take 81 mg by mouth daily.    Marland Kitchen levothyroxine (SYNTHROID, LEVOTHROID) 75 MCG tablet Take 75 mcg by mouth daily before breakfast.    . lisinopril (PRINIVIL,ZESTRIL) 5 MG tablet TAKE ONE (1) TABLET EACH DAY 30 tablet 0  . simvastatin (ZOCOR) 20 MG tablet Take 1 tablet (20 mg total) by mouth at bedtime. (Patient taking differently: Take 20 mg by mouth daily at 6 PM.) 30 tablet 1   No current facility-administered medications for this visit.    Physical Exam BP 124/84 (BP Location: Right Arm, Patient Position: Sitting)   Pulse 86   Resp 20   Ht 5\' 9"  (1.753 m)   Wt 162 lb (73.5 kg)   SpO2 96% Comment: RA with mask on  BMI 23.92 kg/m  Alert and  oriented x3 with no focal deficits No carotid bruits Cardiac regular rate and rhythm with normal S1 and S2 Lungs clear with equal breath sounds bilaterally Pulses intact, no peripheral edema  Diagnostic Tests: CT CHEST WITH CONTRAST  TECHNIQUE: Multidetector CT imaging of the chest was performed during intravenous contrast administration.  CONTRAST:  87mL ISOVUE-300 IOPAMIDOL (ISOVUE-300) INJECTION 61%  COMPARISON:  02/03/2020  FINDINGS: Cardiovascular: Ascending thoracic aortic aneurysm again noted measuring 4.4 cm, stable. Scattered aortic calcifications. No dissection. Heart is normal size with scattered coronary artery calcifications in the left anterior descending coronary artery.  Mediastinum/Nodes: No mediastinal, hilar, or axillary adenopathy. Trachea and esophagus are unremarkable. Thyroid unremarkable.  Lungs/Pleura: Biapical scarring. Calcified granuloma in the right upper lobe. No suspicious pulmonary lesions, confluent opacities or effusions.  Upper Abdomen: Imaging into the upper abdomen demonstrates no acute findings.  Musculoskeletal: Chest wall soft tissues are unremarkable. No acute bony abnormality.  IMPRESSION: Stable ascending thoracic aortic aneurysm, 4.4 cm. Recommend annual imaging followup by CTA or MRA. This recommendation follows 2010 ACCF/AHA/AATS/ACR/ASA/SCA/SCAI/SIR/STS/SVM Guidelines for the Diagnosis and Management of Patients with Thoracic Aortic Disease. Circulation. 2010; 1212011. Aortic aneurysm NOS (ICD10-I71.9)  Coronary artery disease.  Aortic Atherosclerosis (ICD10-I70.0).   Electronically  Signed   By: Charlett Nose M.D.   On: 08/01/2020 09:35 I personally reviewed the CT images and concur with the findings noted above.  Personal measurements were 4.5 cm.  No other significant findings.  Impression: Paul Barrett is a 64 year old male with a history of hypertension, hyperlipidemia, reflux, benign prostatic  hypertrophy, pneumonia, granulomas, and a 4.5 cm ascending aneurysm first discovered in 2016.  Ascending aneurysm/thoracic aortic atherosclerosis-aneurysm first noted in 2016.  Stable since that time.  I still get measurements of 4.5 cm.  Needs continued semiannual follow-up.  Hypertension-blood pressure well controlled on current regimen.  Paul Barrett had questions about baby aspirin.  Recent data suggests it may not be effective for primary prevention.  I really left it up to him Paul Barrett can stop that if Paul Barrett would like.  Plan: Return in 6 months.  We will try to do an MR angio at that time to cut down radiation dose.  I spent over 20 minutes in review of images, records, and in consultation with Paul Barrett today. Loreli Slot, MD [facility] [phone]

## 2020-08-28 DIAGNOSIS — M170 Bilateral primary osteoarthritis of knee: Secondary | ICD-10-CM | POA: Diagnosis not present

## 2020-09-16 DIAGNOSIS — M1712 Unilateral primary osteoarthritis, left knee: Secondary | ICD-10-CM | POA: Diagnosis not present

## 2020-09-16 DIAGNOSIS — M25561 Pain in right knee: Secondary | ICD-10-CM | POA: Diagnosis not present

## 2020-09-22 DIAGNOSIS — M170 Bilateral primary osteoarthritis of knee: Secondary | ICD-10-CM | POA: Diagnosis not present

## 2020-10-02 DIAGNOSIS — M170 Bilateral primary osteoarthritis of knee: Secondary | ICD-10-CM | POA: Diagnosis not present

## 2020-10-09 DIAGNOSIS — M170 Bilateral primary osteoarthritis of knee: Secondary | ICD-10-CM | POA: Diagnosis not present

## 2020-11-20 DIAGNOSIS — M170 Bilateral primary osteoarthritis of knee: Secondary | ICD-10-CM | POA: Diagnosis not present

## 2020-12-22 ENCOUNTER — Other Ambulatory Visit: Payer: Self-pay | Admitting: Thoracic Surgery (Cardiothoracic Vascular Surgery)

## 2020-12-22 DIAGNOSIS — I712 Thoracic aortic aneurysm, without rupture, unspecified: Secondary | ICD-10-CM

## 2020-12-22 DIAGNOSIS — I7121 Aneurysm of the ascending aorta, without rupture: Secondary | ICD-10-CM

## 2021-01-07 ENCOUNTER — Other Ambulatory Visit: Payer: Self-pay

## 2021-01-07 ENCOUNTER — Ambulatory Visit
Admission: RE | Admit: 2021-01-07 | Discharge: 2021-01-07 | Disposition: A | Discharge status: Home / Self Care | Payer: 59 | Source: Ambulatory Visit | Attending: Thoracic Surgery (Cardiothoracic Vascular Surgery) | Admitting: Thoracic Surgery (Cardiothoracic Vascular Surgery)

## 2021-01-07 DIAGNOSIS — I712 Thoracic aortic aneurysm, without rupture, unspecified: Secondary | ICD-10-CM

## 2021-01-07 DIAGNOSIS — I700 Atherosclerosis of aorta: Secondary | ICD-10-CM | POA: Diagnosis not present

## 2021-01-07 MED ORDER — GADOBENATE DIMEGLUMINE 529 MG/ML IV SOLN
15.0000 mL | Freq: Once | INTRAVENOUS | Status: AC | PRN
  Administered 2021-01-07: 15 mL via INTRAVENOUS

## 2021-01-15 DIAGNOSIS — H25013 Cortical age-related cataract, bilateral: Secondary | ICD-10-CM | POA: Diagnosis not present

## 2021-01-15 DIAGNOSIS — H2513 Age-related nuclear cataract, bilateral: Secondary | ICD-10-CM | POA: Diagnosis not present

## 2021-01-15 DIAGNOSIS — H40013 Open angle with borderline findings, low risk, bilateral: Secondary | ICD-10-CM | POA: Diagnosis not present

## 2021-01-15 DIAGNOSIS — H35363 Drusen (degenerative) of macula, bilateral: Secondary | ICD-10-CM | POA: Diagnosis not present

## 2021-01-16 ENCOUNTER — Ambulatory Visit: Payer: 59 | Admitting: Thoracic Surgery (Cardiothoracic Vascular Surgery)

## 2021-01-16 ENCOUNTER — Other Ambulatory Visit: Payer: Self-pay

## 2021-01-16 VITALS — BP 130/70 | HR 76 | Resp 20 | Ht 69.0 in | Wt 160.0 lb

## 2021-01-16 DIAGNOSIS — I7121 Aneurysm of the ascending aorta, without rupture: Secondary | ICD-10-CM

## 2021-01-16 DIAGNOSIS — I712 Thoracic aortic aneurysm, without rupture: Secondary | ICD-10-CM | POA: Diagnosis not present

## 2021-01-16 NOTE — Progress Notes (Signed)
[address]       [name] [address]             [phone]     HPI: [name] returns for follow-up of his ascending aneurysm  Yon Schiffman is a 65 year old man with a history of hypertension, hyperlipidemia, reflux, BPH, lactose intolerance, pneumonia, granuloma, and a 4.5 cm ascending aneurysm.  He was first found to have an ascending aneurysm on a CT in 2016.  There also was a 3 cm masslike opacity in the right lower lobe, which resolved over time.  He has been followed for his aneurysm since then.  Last saw him in the office in December 2021.  He was doing well at that time.  The aneurysm was unchanged.  He continues to do well.  He continues to work.  He is a Visual merchandiser.  He is a lifelong non-smoker.  No chest pain, pressure, tightness, or unusual shortness of breath.  Past Medical History:  Diagnosis Date   Ascending aortic aneurysm ([facility])    BPH (benign prostatic hyperplasia)    GERD (gastroesophageal reflux disease)    H/O hematuria    Hearing loss    Hemorrhoid    Hyperglycemia    Hyperlipidemia    Hypertension    Insomnia    Lactose intolerance    Lung nodule    Pneumonia     Current Outpatient Medications  Medication Sig Dispense Refill   aspirin 81 MG tablet Take 81 mg by mouth daily.     levothyroxine (SYNTHROID, LEVOTHROID) 75 MCG tablet Take 75 mcg by mouth daily before breakfast.     lisinopril (PRINIVIL,ZESTRIL) 5 MG tablet TAKE ONE (1) TABLET EACH DAY 30 tablet 0   simvastatin (ZOCOR) 20 MG tablet Take 1 tablet (20 mg total) by mouth at bedtime. (Patient taking differently: Take 20 mg by mouth daily at 6 PM.) 30 tablet 1   No current facility-administered medications for this visit.    Physical Exam BP 130/70   Pulse 76   Resp 20   Ht 5\' 9"  (1.753 m)   Wt 160 lb (72.6 kg)   SpO2 97% Comment: RA  BMI 23.100 kg/m  65 year old man in no acute distress Alert and oriented x3 with no focal deficits No carotid bruits Cardiac  regular rate and rhythm with normal S1 and S2 Lungs clear bilaterally  Diagnostic Tests: MRA CHEST WITH OR WITHOUT CONTRAST   TECHNIQUE: Angiographic images of the chest were obtained using MRA technique without and with intravenous contrast.   CONTRAST:  21mL MULTIHANCE GADOBENATE DIMEGLUMINE 529 MG/ML IV SOLN   COMPARISON:  CT 02/03/2020 and 07/21/2019, MRI 01/20/2018   FINDINGS: VASCULAR   Aorta: No intramural hematoma or dissection. Ascending thoracic aortic aneurysm is again identified. The thoracic aorta measures 4.0 by 4.5 cm in its ascending segment, stable since prior examination. The aortic arch and descending thoracic aorta at the level of the left atrium are of normal caliber, measuring 2.7 x 2.5 cm and 2.3 x 2.4 cm, respectively. Mild atherosclerotic plaque is seen within the aortic arch, unchanged from multiple prior examinations. Type 1 aortic arch with classic anatomic configuration of the arch vasculature. Wide patency of the arch vasculature at their origins.   Heart: Cardiac size within normal limits.   Pulmonary Arteries: Normal caliber. No central intraluminal filling defects.   Other: No pathologic thoracic adenopathy. The visualized esophagus is unremarkable. No pleural or pericardial effusion.   NON-VASCULAR  Spinal cord: Unremarkable   Brachial plexus: Largely excluded from view.   Muscles and tendons: Unremarkable   Bones: Unremarkable   Joints: Not applicable   IMPRESSION: VASCULAR   Stable ascending thoracic aortic aneurysm with maximal transaxial dimension of 4.0 x 4.5 cm.   NON-VASCULAR   No acute findings.     Electronically Signed   By: Helyn Numbers MD   On: 01/08/2021 03:30  I personally reviewed the CT images.  There is a 4.5 cm ascending aneurysm that is unchanged from his previous study.  Impression: Austen Oyster is a 65 year old man with a history of hypertension, hyperlipidemia, reflux, BPH, lactose intolerance,  pneumonia, granuloma, and a 4.5 cm ascending aneurysm.  Ascending aneurysm-stable at 4.5 cm.  No indication for intervention.  Needs continued semiannual follow-up.  Hypertension-blood pressure well controlled currently on lisinopril.  Hyperlipidemia-on simvastatin  Plan: Return in 6 months with MR angio of chest  Loreli Slot, MD [facility] [phone]

## 2021-02-13 DIAGNOSIS — Z125 Encounter for screening for malignant neoplasm of prostate: Secondary | ICD-10-CM | POA: Diagnosis not present

## 2021-02-13 DIAGNOSIS — E039 Hypothyroidism, unspecified: Secondary | ICD-10-CM | POA: Diagnosis not present

## 2021-02-13 DIAGNOSIS — R739 Hyperglycemia, unspecified: Secondary | ICD-10-CM | POA: Diagnosis not present

## 2021-02-13 DIAGNOSIS — E785 Hyperlipidemia, unspecified: Secondary | ICD-10-CM | POA: Diagnosis not present

## 2021-02-15 DIAGNOSIS — Z1331 Encounter for screening for depression: Secondary | ICD-10-CM | POA: Diagnosis not present

## 2021-02-15 DIAGNOSIS — I10 Essential (primary) hypertension: Secondary | ICD-10-CM | POA: Diagnosis not present

## 2021-02-15 DIAGNOSIS — Z0000 Encounter for general adult medical examination without abnormal findings: Secondary | ICD-10-CM | POA: Diagnosis not present

## 2021-02-15 DIAGNOSIS — Z1389 Encounter for screening for other disorder: Secondary | ICD-10-CM | POA: Diagnosis not present

## 2021-06-18 ENCOUNTER — Other Ambulatory Visit: Payer: Self-pay | Admitting: Thoracic Surgery (Cardiothoracic Vascular Surgery)

## 2021-06-18 DIAGNOSIS — I7121 Aneurysm of the ascending aorta, without rupture: Secondary | ICD-10-CM

## 2021-07-24 ENCOUNTER — Ambulatory Visit
Admission: RE | Admit: 2021-07-24 | Discharge: 2021-07-24 | Disposition: A | Discharge status: Home / Self Care | Payer: BC Managed Care – PPO | Source: Ambulatory Visit | Attending: Thoracic Surgery (Cardiothoracic Vascular Surgery) | Admitting: Thoracic Surgery (Cardiothoracic Vascular Surgery)

## 2021-07-24 ENCOUNTER — Ambulatory Visit: Payer: BC Managed Care – PPO | Admitting: Thoracic Surgery (Cardiothoracic Vascular Surgery)

## 2021-07-24 ENCOUNTER — Other Ambulatory Visit: Payer: Self-pay

## 2021-07-24 VITALS — BP 143/78 | HR 76 | Resp 20 | Wt 165.0 lb

## 2021-07-24 DIAGNOSIS — I7121 Aneurysm of the ascending aorta, without rupture: Secondary | ICD-10-CM

## 2021-07-24 DIAGNOSIS — I712 Thoracic aortic aneurysm, without rupture, unspecified: Secondary | ICD-10-CM | POA: Diagnosis not present

## 2021-07-24 MED ORDER — GADOBENATE DIMEGLUMINE 529 MG/ML IV SOLN
15.0000 mL | Freq: Once | INTRAVENOUS | Status: AC | PRN
  Administered 2021-07-24: 14:00:00 15 mL via INTRAVENOUS

## 2021-07-24 NOTE — Progress Notes (Signed)
[address]       [place] [address]             [phone]     HPI: [name] returns for a scheduled follow-up visit regarding his ascending aneurysm.  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  The aneurysm was first noted on CT in 2016.  There was a masslike opacity in the right lower lobe as well, but that resolved over time.  Last saw him in the office in June 2022.  His aneurysm was stable at 4.5 cm.  In the interim since his last visit he has been feeling well.  He has not had any new health issues.  He continues to work on his farm.  Past Medical History:  Diagnosis Date   Ascending aortic aneurysm (HCC)    BPH (benign prostatic hyperplasia)    GERD (gastroesophageal reflux disease)    H/O hematuria    Hearing loss    Hemorrhoid    Hyperglycemia    Hyperlipidemia    Hypertension    Insomnia    Lactose intolerance    Lung nodule    Pneumonia     Current Outpatient Medications  Medication Sig Dispense Refill   cholecalciferol (VITAMIN D3) 25 MCG (1000 UNIT) tablet Take 1,000 Units by mouth daily.     levothyroxine (SYNTHROID, LEVOTHROID) 75 MCG tablet Take 75 mcg by mouth daily before breakfast.     lisinopril (PRINIVIL,ZESTRIL) 5 MG tablet TAKE ONE (1) TABLET EACH DAY 30 tablet 0   simvastatin (ZOCOR) 20 MG tablet Take 1 tablet (20 mg total) by mouth at bedtime. (Patient taking differently: Take 20 mg by mouth daily at 6 PM.) 30 tablet 1   No current facility-administered medications for this visit.    Physical Exam BP (!) 143/78 (BP Location: Right Arm, Patient Position: Sitting)    Pulse 76    Resp 20    Wt 165 lb (74.8 kg)    SpO2 98% Comment: RA   BMI 24.64 kg/m  65 year old man in no acute distress Alert and oriented x3 with no focal deficits Lungs clear bilaterally Cardiac regular  rate and rhythm with normal S1 and S2 No cervical or supraclavicular adenopathy, no carotid bruits  Diagnostic Tests: MRA Chest with and without contrast   TECHNIQUE: Angiographic images of the chest were obtained using MRA technique without and with intravenous contrast.   CONTRAST:  Fifteen mL MultiHance, intravenous   COMPARISON:  01/07/2021   FINDINGS: Cardiovascular: Preferential opacification of the thoracic aorta. Similar appearing fusiform aneurysmal changes of the ascending thoracic aorta which measures up to 45 mm in maximum diameter. No evidence of thoracic aortic dissection. Normal heart size. No pericardial effusion. The heart is normal in size. No pericardial effusion.   Mediastinum/Nodes: No enlarged mediastinal, hilar, or axillary lymph nodes. Thyroid gland, trachea, and esophagus demonstrate no significant findings.   Lungs/Pleura: No focal consolidations. No pleural effusion or pneumothorax.   Upper Abdomen: The visualized upper abdomen is within normal limits.   Musculoskeletal: No chest wall abnormality. No acute or significant osseous findings.   IMPRESSION: Vascular:   Unchanged fusiform ascending thoracic aortic aneurysm measuring up to 4.5 cm. Ascending thoracic aortic aneurysm. Recommend semi-annual imaging followup by CTA or MRA and referral to cardiothoracic surgery if not already obtained. This recommendation follows 2010 ACCF/AHA/AATS/ACR/ASA/SCA/SCAI/SIR/STS/SVM Guidelines for  the Diagnosis and Management of Patients With Thoracic Aortic Disease. Circulation. 2010; 121JN:9224643. Aortic aneurysm NOS (ICD10-I71.9)   Non-Vascular:   No acute intrathoracic abnormality.   Paul Cancer, MD   [facility]   [facility]     Electronically Signed   By: Paul Barrett M.D.   On: 07/24/2021 14:15 I personally reviewed the MR images.  There is an unchanged 4.5 cm ascending aneurysm.  Returns to  normal size in the arch.  Impression: Paul Barrett is a 65 year old man with a history of hypertension, hyperlipidemia, reflux, BPH, lactose intolerance, pneumonia, left upper lobe granuloma, and a 4.5 cm ascending aneurysm.  Ascending aneurysm-stable at 4.5 cm.  Needs continued semiannual follow-up.  His wife is concerned about his lungs.  He has a small granulomatous calcified.  There were no suspicious lung nodules.  We will do a CT at his next visit to look at the lungs as well as the aneurysm.  Hypertension-blood pressure mildly elevated 143/78.  He is not checking himself on a regular basis at home.  Advised him to do so.  We want his systolic pressure less than 130.  I did not make any medication changes today because is only very minimally elevated.  Hyperlipidemia-on simvastatin, followed by Dr. Virgina Barrett.  Plan: Return in 6 months with CT angio of chest  Paul Nakayama, MD [facility] [phone]

## 2021-09-28 DIAGNOSIS — L814 Other melanin hyperpigmentation: Secondary | ICD-10-CM | POA: Diagnosis not present

## 2021-09-28 DIAGNOSIS — D225 Melanocytic nevi of trunk: Secondary | ICD-10-CM | POA: Diagnosis not present

## 2021-09-28 DIAGNOSIS — Z85828 Personal history of other malignant neoplasm of skin: Secondary | ICD-10-CM | POA: Diagnosis not present

## 2021-09-28 DIAGNOSIS — L821 Other seborrheic keratosis: Secondary | ICD-10-CM | POA: Diagnosis not present

## 2021-10-18 DIAGNOSIS — H903 Sensorineural hearing loss, bilateral: Secondary | ICD-10-CM | POA: Diagnosis not present

## 2021-12-12 ENCOUNTER — Other Ambulatory Visit: Payer: Self-pay | Admitting: Thoracic Surgery (Cardiothoracic Vascular Surgery)

## 2021-12-12 DIAGNOSIS — I7121 Aneurysm of the ascending aorta, without rupture: Secondary | ICD-10-CM

## 2022-01-22 ENCOUNTER — Ambulatory Visit: Payer: Self-pay | Admitting: Thoracic Surgery (Cardiothoracic Vascular Surgery)

## 2022-01-22 ENCOUNTER — Inpatient Hospital Stay: Admission: RE | Admit: 2022-01-22 | Payer: BC Managed Care – PPO | Source: Ambulatory Visit

## 2022-01-24 ENCOUNTER — Ambulatory Visit
Admission: RE | Admit: 2022-01-24 | Discharge: 2022-01-24 | Disposition: A | Discharge status: Home / Self Care | Payer: Medicare Other | Source: Ambulatory Visit | Attending: Thoracic Surgery (Cardiothoracic Vascular Surgery) | Admitting: Thoracic Surgery (Cardiothoracic Vascular Surgery)

## 2022-01-24 DIAGNOSIS — I7121 Aneurysm of the ascending aorta, without rupture: Secondary | ICD-10-CM

## 2022-01-24 MED ORDER — IOPAMIDOL (ISOVUE-370) INJECTION 76%
75.0000 mL | Freq: Once | INTRAVENOUS | Status: AC | PRN
  Administered 2022-01-24: 75 mL via INTRAVENOUS

## 2022-03-05 ENCOUNTER — Ambulatory Visit (INDEPENDENT_AMBULATORY_CARE_PROVIDER_SITE_OTHER): Payer: Medicare Other | Admitting: Thoracic Surgery (Cardiothoracic Vascular Surgery)

## 2022-03-05 ENCOUNTER — Encounter: Payer: Self-pay | Admitting: Thoracic Surgery (Cardiothoracic Vascular Surgery)

## 2022-03-05 VITALS — BP 109/74 | HR 87 | Resp 18 | Ht 69.0 in | Wt 167.0 lb

## 2022-03-05 DIAGNOSIS — I7121 Aneurysm of the ascending aorta, without rupture: Secondary | ICD-10-CM

## 2022-03-05 NOTE — Progress Notes (Signed)
[address]       [name] [address]             [phone]     HPI: Mr. Delacruz returns for follow-up of his ascending aneurysm.  Dontrail Blackwell is a 66 year old man with a history of hypertension, hyperlipidemia, 4.5 cm ascending aneurysm, reflux, hiatal hernia, BPH, lactose intolerance, pneumonia, and left upper lobe granuloma.  He was first found to have an aneurysm in 2016.  Last saw him in the office in December 2022.  The aneurysm was stable at 4.5 cm.  In the interim since his last visit he has been feeling well.  His mother passed away last week at the age of 30.  He is not having any chest pain, pressure, tightness, or shortness of breath.  Still works on his farm.  He does complain of occasional cough frequently when he lies down at night.   Past Medical History:  Diagnosis Date   Ascending aortic aneurysm (HCC)    BPH (benign prostatic hyperplasia)    GERD (gastroesophageal reflux disease)    H/O hematuria    Hearing loss    Hemorrhoid    Hyperglycemia    Hyperlipidemia    Hypertension    Insomnia    Lactose intolerance    Lung nodule    Pneumonia     Current Outpatient Medications  Medication Sig Dispense Refill   cholecalciferol (VITAMIN D3) 25 MCG (1000 UNIT) tablet Take 1,000 Units by mouth daily.     levothyroxine (SYNTHROID, LEVOTHROID) 75 MCG tablet Take 75 mcg by mouth daily before breakfast.     lisinopril (PRINIVIL,ZESTRIL) 5 MG tablet TAKE ONE (1) TABLET EACH DAY 30 tablet 0   simvastatin (ZOCOR) 20 MG tablet Take 1 tablet (20 mg total) by mouth at bedtime. (Patient taking differently: Take 20 mg by mouth daily at 6 PM.) 30 tablet 1   No current facility-administered medications for this visit.    Physical Exam BP 109/74 (BP Location: Right Arm, Patient Position: Sitting)   Pulse 87   Resp 18   Ht 5\' 9"  (1.753 m)   Wt 167 lb (75.8 kg)   SpO2 96% Comment: RA  BMI 24.66 kg/m  Well-appearing 66 year old man in no acute  distress Alert and oriented x3 with no focal deficits Lungs clear bilaterally Cardiac regular rate and rhythm with normal S1 and S2 with no rubs or murmurs No carotid bruits No peripheral edema  Diagnostic Tests: CT ANGIOGRAPHY CHEST WITH CONTRAST   TECHNIQUE: Multidetector CT imaging of the chest was performed using the standard protocol during bolus administration of intravenous contrast. Multiplanar CT image reconstructions and MIPs were obtained to evaluate the vascular anatomy.   RADIATION DOSE REDUCTION: This exam was performed according to the departmental dose-optimization program which includes automated exposure control, adjustment of the mA and/or kV according to patient size and/or use of iterative reconstruction technique.   CONTRAST:  29mL ISOVUE-370 IOPAMIDOL (ISOVUE-370) INJECTION 76%   COMPARISON:  MR angiogram 07/24/2021, CT angiogram, 08/01/2020   FINDINGS: Cardiovascular: Preferential opacification of the thoracic aorta. Unchanged aneurysm of the tubular ascending thoracic aorta measuring up to 4.6 x 4.5 cm. The aortic valve measures up to 2.2 cm in caliber. The sinuses of Valsalva measure up to 4.3 cm. Normal caliber of the descending thoracic aorta, measuring up to 2.4 x 2.3 cm. Mild aortic atherosclerosis. No evidence of pulmonary embolism on this non tailored examination. Normal heart size. No pericardial effusion.  Mediastinum/Nodes: No enlarged mediastinal, hilar, or axillary lymph nodes. Small hiatal hernia. Thyroid gland, trachea, and esophagus demonstrate no significant findings.   Lungs/Pleura: Lungs are clear. No pleural effusion or pneumothorax.   Upper Abdomen: No acute abnormality.   Musculoskeletal: No chest wall abnormality. No acute osseous findings.   Review of the MIP images confirms the above findings.   IMPRESSION: 1. Unchanged aneurysm of the tubular ascending thoracic aorta measuring up to 4.6 x 4.5 cm. Ascending thoracic  aortic aneurysm. Recommend semi-annual imaging followup by CTA or MRA and referral to cardiothoracic surgery if not already obtained. This recommendation follows 2010 ACCF/AHA/AATS/ACR/ASA/SCA/SCAI/SIR/STS/SVM Guidelines for the Diagnosis and Management of Patients With Thoracic Aortic Disease. Circulation. 2010; 121: [id number]. Aortic aneurysm NOS (ICD10-I71.9) 2. Small hiatal hernia.   Aortic Atherosclerosis (ICD10-I70.0).     Electronically Signed   By: Jearld Lesch M.D.   On: 01/24/2022 11:22 I personally reviewed the CT images.  4.5 cm ascending aneurysm.  No change from previous study.  Impression: Paul Barrett is a 66 year old man with a history of hypertension, hyperlipidemia, 4.5 cm ascending aneurysm, reflux, hiatal hernia, BPH, lactose intolerance, pneumonia, and left upper lobe granuloma.  Ascending aneurysm-stable at 4.5 cm.  Needs continued semiannual follow-up.  Hypertension-blood pressure well controlled on lisinopril.  Hyperlipidemia-on simvastatin.  Cough-noticed this more when he lies down at night.  Could be due to postnasal drip.  Another possibility would be reflux related as he does have a small hiatal hernia.  Could try elevating the head of his bed and/or taking an antacid.  Plan: Return in 6 months with MR angio of chest  Loreli Slot, MD [facility] [phone]

## 2022-03-12 ENCOUNTER — Ambulatory Visit: Payer: Self-pay | Admitting: Thoracic Surgery (Cardiothoracic Vascular Surgery)

## 2022-08-14 ENCOUNTER — Other Ambulatory Visit: Payer: Self-pay | Admitting: Thoracic Surgery (Cardiothoracic Vascular Surgery)

## 2022-08-14 DIAGNOSIS — I7121 Aneurysm of the ascending aorta, without rupture: Secondary | ICD-10-CM

## 2022-09-24 ENCOUNTER — Ambulatory Visit: Payer: Medicare Other | Admitting: Thoracic Surgery (Cardiothoracic Vascular Surgery)

## 2022-09-24 ENCOUNTER — Encounter: Payer: Self-pay | Admitting: Thoracic Surgery (Cardiothoracic Vascular Surgery)

## 2022-09-24 ENCOUNTER — Ambulatory Visit
Admission: RE | Admit: 2022-09-24 | Discharge: 2022-09-24 | Disposition: A | Discharge status: Home / Self Care | Payer: Medicare Other | Source: Ambulatory Visit | Attending: Thoracic Surgery (Cardiothoracic Vascular Surgery) | Admitting: Thoracic Surgery (Cardiothoracic Vascular Surgery)

## 2022-09-24 VITALS — BP 123/83 | HR 65 | Resp 20 | Ht 69.0 in | Wt 165.0 lb

## 2022-09-24 DIAGNOSIS — I7121 Aneurysm of the ascending aorta, without rupture: Secondary | ICD-10-CM | POA: Diagnosis not present

## 2022-09-24 MED ORDER — IOPAMIDOL (ISOVUE-370) INJECTION 76%
75.0000 mL | Freq: Once | INTRAVENOUS | Status: AC | PRN
  Administered 2022-09-24: 75 mL via INTRAVENOUS

## 2022-09-24 NOTE — Progress Notes (Signed)
[address]       [place] [address]             [phone]     HPI: [name] returns for follow-up of his ascending aneurysm.  Paul Barrett is a 67 year old man with a past history of hypertension, hyperlipidemia, reflux, BPH, lactose intolerance, pneumonia, granuloma, and a 4.5 cm ascending aneurysm.  Aneurysm was first noted in 2016.  Has been essentially unchanged over time.  He also had a 3 cm masslike opacity in the right lower lobe on his for scan.  That resolved over time.  I last saw him in August 2023 and he was doing well at that time.  In the interim since his last visit, he has been doing well.  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  No chest pain, pressure, or tightness.  Does continue to have a nonproductive cough.  Past Medical History:  Diagnosis Date   Ascending aortic aneurysm (HCC)    BPH (benign prostatic hyperplasia)    GERD (gastroesophageal reflux disease)    H/O hematuria    Hearing loss    Hemorrhoid    Hyperglycemia    Hyperlipidemia    Hypertension    Insomnia    Lactose intolerance    Lung nodule    Pneumonia     Current Outpatient Medications  Medication Sig Dispense Refill   cholecalciferol (VITAMIN D3) 25 MCG (1000 UNIT) tablet Take 1,000 Units by mouth daily.     levothyroxine (SYNTHROID, LEVOTHROID) 75 MCG tablet Take 75 mcg by mouth daily before breakfast.     lisinopril (PRINIVIL,ZESTRIL) 5 MG tablet TAKE ONE (1) TABLET EACH DAY 30 tablet 0   simvastatin (ZOCOR) 20 MG tablet Take 1 tablet (20 mg total) by mouth at bedtime. (Patient taking differently: Take 20 mg by mouth daily at 6 PM.) 30 tablet 1   No current facility-administered medications for this visit.    Physical Exam BP 123/83 (BP Location: Left Arm, Patient Position: Sitting, Cuff Size: Normal)   Pulse 65   Resp 20   Ht 5' 9"$  (1.753 m)   Wt 165 lb (74.8 kg)   SpO2 100% Comment: RA  BMI 24.32 kg/m  67 year old man in no acute  distress Well-developed and well-nourished Alert and oriented x 3 with no focal deficits No carotid bruits Cardiac regular rate and rhythm with no rubs, murmurs, or gallops Lungs clear with equal breath sounds bilaterally No peripheral edema  Diagnostic Tests: CT ANGIOGRAPHY CHEST WITH CONTRAST   TECHNIQUE: Multidetector CT imaging of the chest was performed using the standard protocol during bolus administration of intravenous contrast. Multiplanar CT image reconstructions and MIPs were obtained to evaluate the vascular anatomy.   RADIATION DOSE REDUCTION: This exam was performed according to the departmental dose-optimization program which includes automated exposure control, adjustment of the mA and/or kV according to patient size and/or use of iterative reconstruction technique.   CONTRAST:  80m ISOVUE-370 IOPAMIDOL (ISOVUE-370) INJECTION 76%   COMPARISON:  01/24/2022   FINDINGS: Cardiovascular: Aortic atherosclerosis. Normal caliber of the great vessels, transverse, and descending thoracic aorta. Based on transverse images, the aorta measures maximally 4.4 cm in the mid ascending segment on 58/4 versus 4.3 cm when remeasured in a similar fashion on the prior. Based on coronal reformats, the aorta measures as follows:   3.8 cm at the level of the sinuses versus 3.9 cm when remeasured in a similar fashion.   3.2 cm  at the sinotubular junction versus 3.3 cm when remeasured in a similar fashion.   4.3 cm in the mid ascending segment versus 4.2 cm on the prior exam when measured in a similar fashion.   Based on sagittal reformats, the ascending aorta measures maximally 4.2 cm, similar to on the prior when remeasured.   Normal heart size, without pericardial effusion. No central pulmonary embolism, on this non-dedicated study.   Mediastinum/Nodes: No mediastinal adenopathy. An upper normal right hilar node of 1.2 cm is similar and likely reactive.   Lungs/Pleura:  No pleural fluid. Biapical pleuroparenchymal scarring.   Right upper lobe calcified granuloma on 41/11.   Upper Abdomen: Normal imaged portions of the liver, spleen, stomach, pancreas, gallbladder, adrenal glands, kidneys.   Musculoskeletal: Mild L1 superior endplate compression deformity is similar, without ventral canal encroachment.   Review of the MIP images confirms the above findings.   IMPRESSION: 1. Similar mild ascending aortic aneurysm, on the order of maximally 4.4 cm. Recommend annual imaging followup by CTA or MRA. This recommendation follows 2010 ACCF/AHA/AATS/ACR/ASA/SCA/SCAI/SIR/STS/SVM Guidelines for the Diagnosis and Management of Patients with Thoracic Aortic Disease. Circulation. 2010; 121JN:9224643. Aortic aneurysm NOS (ICD10-I71.9) 2.  No acute process in the chest. 3.  Aortic Atherosclerosis (ICD10-I70.0).     Electronically Signed   By: Paul Barrett M.D.   On: 09/24/2022 11:59 I personally reviewed the CT images.  No change in the 4.4 to 4.5 cm ascending aneurysm.  Impression: Paul Barrett is a 67 year old man with a past history of hypertension, hyperlipidemia, reflux, BPH, lactose intolerance, pneumonia, granuloma, and a 4.5 cm ascending aneurysm.  Ascending aneurysm-has been stable for years around 4.5 cm.  At this point I think we can safely go to annual follow-up.  Will plan to do another CT angiogram of the chest in a year.  Chronic cough-no findings on the chest x-ray to explain his cough.  Recommended following up with Dr. Virgina Barrett.  Hypertension-blood pressure well-controlled with low-dose lisinopril.  Hyperlipidemia-he is on a statin.  Plan: Return in 1 year with CT angio chest  Paul Nakayama, MD [facility] [phone]

## 2023-08-13 ENCOUNTER — Other Ambulatory Visit: Payer: Self-pay | Admitting: Thoracic Surgery (Cardiothoracic Vascular Surgery)

## 2023-08-13 DIAGNOSIS — I7121 Aneurysm of the ascending aorta, without rupture: Secondary | ICD-10-CM

## 2023-09-30 ENCOUNTER — Ambulatory Visit
Admission: RE | Admit: 2023-09-30 | Discharge: 2023-09-30 | Disposition: A | Discharge status: Home / Self Care | Payer: Medicare Other | Source: Ambulatory Visit | Attending: Thoracic Surgery (Cardiothoracic Vascular Surgery) | Admitting: Thoracic Surgery (Cardiothoracic Vascular Surgery)

## 2023-09-30 ENCOUNTER — Ambulatory Visit: Payer: Medicare Other | Admitting: Thoracic Surgery (Cardiothoracic Vascular Surgery)

## 2023-09-30 DIAGNOSIS — I7121 Aneurysm of the ascending aorta, without rupture: Secondary | ICD-10-CM

## 2023-09-30 MED ORDER — IOPAMIDOL (ISOVUE-370) INJECTION 76%
100.0000 mL | Freq: Once | INTRAVENOUS | Status: AC | PRN
  Administered 2023-09-30: 75 mL via INTRAVENOUS

## 2023-10-03 ENCOUNTER — Ambulatory Visit: Payer: Medicare Other | Admitting: Thoracic Surgery (Cardiothoracic Vascular Surgery)

## 2023-10-03 ENCOUNTER — Encounter: Payer: Self-pay | Admitting: Thoracic Surgery (Cardiothoracic Vascular Surgery)

## 2023-10-03 VITALS — BP 114/69 | HR 94 | Resp 18 | Ht 69.0 in | Wt 166.0 lb

## 2023-10-03 DIAGNOSIS — I7121 Aneurysm of the ascending aorta, without rupture: Secondary | ICD-10-CM

## 2023-10-03 NOTE — Progress Notes (Signed)
 [address]       [name] [address]             [phone]    HPI: [name] returns for a scheduled follow-up visit regarding his ascending aneurysm  Paul Barrett is a 68 year old man with a past history of hypertension, hyperlipidemia, reflux, BPH, lactose intolerance, pneumonia, granuloma, and an ascending aneurysm.   He was first noted to have an ascending aneurysm in 2016.  He has been followed since that time.  He has remained unchanged.  On his initial presentation he had a masslike opacity in the right lower lobe, but that resolved over time.  The last saw him in February 2024.  He was doing well at that time.  Continues to work.  No chest pain, pressure, or tightness.  He had a cold a couple of weeks ago and is continued to have a cough even though he feels better overall.  Past Medical History:  Diagnosis Date   Ascending aortic aneurysm (HCC)    BPH (benign prostatic hyperplasia)    GERD (gastroesophageal reflux disease)    H/O hematuria    Hearing loss    Hemorrhoid    Hyperglycemia    Hyperlipidemia    Hypertension    Insomnia    Lactose intolerance    Lung nodule    Pneumonia     Current Outpatient Medications  Medication Sig Dispense Refill   cholecalciferol (VITAMIN D3) 25 MCG (1000 UNIT) tablet Take 1,000 Units by mouth daily.     levothyroxine (SYNTHROID, LEVOTHROID) 75 MCG tablet Take 75 mcg by mouth daily before breakfast.     lisinopril (PRINIVIL,ZESTRIL) 5 MG tablet TAKE ONE (1) TABLET EACH DAY 30 tablet 0   simvastatin (ZOCOR) 20 MG tablet Take 1 tablet (20 mg total) by mouth at bedtime. (Patient taking differently: Take 20 mg by mouth daily at 6 PM.) 30 tablet 1   No current facility-administered medications for this visit.    Physical Exam BP 114/69 (BP Location: Right Arm)   Pulse 94   Resp 18   Ht 5\' 9"  (1.753 m)   Wt 166 lb (75.3 kg)   SpO2 98% Comment: RA  BMI 24.51 kg/m  Well-appearing 68 year old man in no  acute distress Alert and oriented x 3 with no focal deficits Lungs clear with equal breath sounds bilaterally Cardiac regular rate and rhythm with normal S1 and S2, no rubs, murmurs, or gallops No carotid bruits No peripheral edema  Diagnostic Tests: CT ANGIOGRAPHY CHEST WITH CONTRAST   TECHNIQUE: Multidetector CT imaging of the chest was performed using the standard protocol during bolus administration of intravenous contrast. Multiplanar CT image reconstructions and MIPs were obtained to evaluate the vascular anatomy.   RADIATION DOSE REDUCTION: This exam was performed according to the departmental dose-optimization program which includes automated exposure control, adjustment of the mA and/or kV according to patient size and/or use of iterative reconstruction technique.   CONTRAST:  75mL ISOVUE-370 IOPAMIDOL (ISOVUE-370) INJECTION 76%   COMPARISON:  09/24/2022 and previous   FINDINGS: Cardiovascular: Heart size normal. Trace pericardial fluid. Satisfactory opacification of pulmonary arteries noted, and there is no evidence of pulmonary emboli. There is good contrast opacification of the thoracic aorta, without dissection or stenosis. Classic 3-vessel brachiocephalic arterial origin anatomy without proximal stenosis. Scattered calcified plaque in the descending segment.   Aortic Root:   --Valve: 2.1 cm   --Sinuses: 3.9 cm   --Sinotubular Junction: 3.5 cm  Limitations by motion: Moderate   Thoracic Aorta:   --Ascending Aorta: 4.4 cm (stable since 09/24/2022)   --Aortic Arch: 3.7 cm   --Descending Aorta: 2.7 cm   Mediastinum/Nodes: No hematoma, mass, or adenopathy.   Lungs/Pleura: No pleural effusion. No pneumothorax. Minimal dependent atelectasis in the lower lobes. Lungs otherwise clear.   Upper Abdomen: No acute findings.   Musculoskeletal: Vertebral endplate spurring at multiple levels in the lower thoracic spine. Stable L1 vertebral compression  deformity.   Review of the MIP images confirms the above findings.   IMPRESSION: 1. Stable 4.4 cm ascending thoracic aortic aneurysm. Recommend annual imaging followup by CTA or MRA. This recommendation follows 2010 ACCF/AHA/AATS/ACR/ASA/SCA/SCAI/SIR/STS/SVM Guidelines for the Diagnosis and Management of Patients with Thoracic Aortic Disease. Circulation. 2010; 121: Z610-R604. Aortic aneurysm NOS (ICD10-I71.9)     Electronically Signed   By: Corlis Leak M.D.   On: 09/30/2023 15:25 I personally reviewed the CT images.  No change in the 4.4 to 4.5 cm ascending aneurysm.  Thoracic aortic atherosclerosis.  Impression: Paul Barrett is a 68 year old man with a past history of hypertension, hyperlipidemia, reflux, BPH, lactose intolerance, pneumonia, granuloma, and an ascending aneurysm.   Ascending aneurysm/thoracic aortic atherosclerosis-aneurysm stable dating back to 2016.  Needs continued annual follow-up.  Blood pressure well-controlled.  Hypertension-blood pressure well-controlled.  Hyperlipidemia-he is on a statin.  Plan: Return in 1 year with CT angio of chest to follow-up ascending aneurysm.  Loreli Slot, MD [facility] [phone]

## 2024-08-16 ENCOUNTER — Other Ambulatory Visit: Payer: Self-pay | Admitting: Thoracic Surgery (Cardiothoracic Vascular Surgery)

## 2024-08-16 DIAGNOSIS — I7121 Aneurysm of the ascending aorta, without rupture: Secondary | ICD-10-CM

## 2024-09-16 ENCOUNTER — Ambulatory Visit (HOSPITAL_COMMUNITY)

## 2024-09-30 ENCOUNTER — Ambulatory Visit
